# Patient Record
Sex: Female | Born: 1965 | Race: Black or African American | Hispanic: No | Smoking: Current every day smoker
Health system: Southern US, Community
[De-identification: ages and names within clinical notes are randomized; demographics above are authoritative.]

---

## 1999-12-30 ENCOUNTER — Encounter: Payer: Self-pay | Admitting: Emergency Medicine

## 1999-12-30 ENCOUNTER — Emergency Department (HOSPITAL_COMMUNITY): Admission: EM | Admit: 1999-12-30 | Discharge: 1999-12-30 | Payer: Self-pay | Admitting: Emergency Medicine

## 2001-02-02 ENCOUNTER — Emergency Department (HOSPITAL_COMMUNITY): Admission: EM | Admit: 2001-02-02 | Discharge: 2001-02-03 | Payer: Self-pay | Admitting: Emergency Medicine

## 2001-07-07 ENCOUNTER — Encounter: Payer: Self-pay | Admitting: Emergency Medicine

## 2001-07-07 ENCOUNTER — Emergency Department (HOSPITAL_COMMUNITY): Admission: EM | Admit: 2001-07-07 | Discharge: 2001-07-07 | Payer: Self-pay | Admitting: Emergency Medicine

## 2001-12-06 ENCOUNTER — Emergency Department (HOSPITAL_COMMUNITY): Admission: EM | Admit: 2001-12-06 | Discharge: 2001-12-06 | Payer: Self-pay | Admitting: Emergency Medicine

## 2002-01-18 ENCOUNTER — Emergency Department (HOSPITAL_COMMUNITY): Admission: EM | Admit: 2002-01-18 | Discharge: 2002-01-18 | Payer: Self-pay | Admitting: Emergency Medicine

## 2002-10-22 ENCOUNTER — Emergency Department (HOSPITAL_COMMUNITY): Admission: EM | Admit: 2002-10-22 | Discharge: 2002-10-22 | Payer: Self-pay | Admitting: Emergency Medicine

## 2002-11-25 ENCOUNTER — Encounter: Payer: Self-pay | Admitting: Emergency Medicine

## 2002-11-25 ENCOUNTER — Emergency Department (HOSPITAL_COMMUNITY): Admission: EM | Admit: 2002-11-25 | Discharge: 2002-11-25 | Payer: Self-pay | Admitting: Emergency Medicine

## 2003-02-05 ENCOUNTER — Emergency Department (HOSPITAL_COMMUNITY): Admission: EM | Admit: 2003-02-05 | Discharge: 2003-02-05 | Payer: Self-pay | Admitting: Emergency Medicine

## 2003-02-05 ENCOUNTER — Encounter: Payer: Self-pay | Admitting: Emergency Medicine

## 2003-04-17 ENCOUNTER — Emergency Department (HOSPITAL_COMMUNITY): Admission: EM | Admit: 2003-04-17 | Discharge: 2003-04-17 | Payer: Self-pay | Admitting: Emergency Medicine

## 2003-04-17 ENCOUNTER — Encounter: Payer: Self-pay | Admitting: Emergency Medicine

## 2003-05-01 ENCOUNTER — Emergency Department (HOSPITAL_COMMUNITY): Admission: EM | Admit: 2003-05-01 | Discharge: 2003-05-01 | Payer: Self-pay | Admitting: *Deleted

## 2003-05-01 ENCOUNTER — Encounter: Payer: Self-pay | Admitting: Emergency Medicine

## 2003-05-16 ENCOUNTER — Encounter: Admission: RE | Admit: 2003-05-16 | Discharge: 2003-05-16 | Payer: Self-pay | Admitting: Internal Medicine

## 2003-06-02 ENCOUNTER — Ambulatory Visit (HOSPITAL_COMMUNITY): Admission: RE | Admit: 2003-06-02 | Discharge: 2003-06-02 | Payer: Self-pay | Admitting: Internal Medicine

## 2003-06-02 ENCOUNTER — Encounter: Payer: Self-pay | Admitting: Internal Medicine

## 2003-06-04 ENCOUNTER — Encounter: Admission: RE | Admit: 2003-06-04 | Discharge: 2003-06-04 | Payer: Self-pay | Admitting: Internal Medicine

## 2003-06-09 ENCOUNTER — Encounter: Admission: RE | Admit: 2003-06-09 | Discharge: 2003-06-09 | Payer: Self-pay | Admitting: Internal Medicine

## 2003-06-09 ENCOUNTER — Ambulatory Visit (HOSPITAL_COMMUNITY): Admission: RE | Admit: 2003-06-09 | Discharge: 2003-06-09 | Payer: Self-pay | Admitting: Internal Medicine

## 2003-06-09 ENCOUNTER — Encounter: Payer: Self-pay | Admitting: Internal Medicine

## 2003-10-07 ENCOUNTER — Encounter: Admission: RE | Admit: 2003-10-07 | Discharge: 2003-10-07 | Payer: Self-pay | Admitting: Internal Medicine

## 2003-10-07 ENCOUNTER — Ambulatory Visit (HOSPITAL_COMMUNITY): Admission: RE | Admit: 2003-10-07 | Discharge: 2003-10-07 | Payer: Self-pay | Admitting: Internal Medicine

## 2003-10-14 ENCOUNTER — Emergency Department (HOSPITAL_COMMUNITY): Admission: EM | Admit: 2003-10-14 | Discharge: 2003-10-14 | Payer: Self-pay | Admitting: Family Medicine

## 2003-11-10 ENCOUNTER — Emergency Department (HOSPITAL_COMMUNITY): Admission: EM | Admit: 2003-11-10 | Discharge: 2003-11-10 | Payer: Self-pay | Admitting: Emergency Medicine

## 2003-12-23 ENCOUNTER — Emergency Department (HOSPITAL_COMMUNITY): Admission: EM | Admit: 2003-12-23 | Discharge: 2003-12-23 | Payer: Self-pay | Admitting: Family Medicine

## 2004-04-01 ENCOUNTER — Encounter: Admission: RE | Admit: 2004-04-01 | Discharge: 2004-04-01 | Payer: Self-pay | Admitting: Internal Medicine

## 2004-04-05 ENCOUNTER — Ambulatory Visit (HOSPITAL_COMMUNITY): Admission: RE | Admit: 2004-04-05 | Discharge: 2004-04-05 | Payer: Self-pay | Admitting: Internal Medicine

## 2004-04-08 ENCOUNTER — Encounter: Admission: RE | Admit: 2004-04-08 | Discharge: 2004-04-08 | Payer: Self-pay | Admitting: Internal Medicine

## 2004-04-29 ENCOUNTER — Ambulatory Visit: Payer: Self-pay | Admitting: Internal Medicine

## 2004-07-19 ENCOUNTER — Emergency Department (HOSPITAL_COMMUNITY): Admission: EM | Admit: 2004-07-19 | Discharge: 2004-07-19 | Payer: Self-pay | Admitting: Emergency Medicine

## 2004-09-20 ENCOUNTER — Emergency Department (HOSPITAL_COMMUNITY): Admission: EM | Admit: 2004-09-20 | Discharge: 2004-09-20 | Payer: Self-pay | Admitting: Emergency Medicine

## 2004-10-12 ENCOUNTER — Ambulatory Visit: Payer: Self-pay | Admitting: Internal Medicine

## 2004-10-12 ENCOUNTER — Ambulatory Visit (HOSPITAL_COMMUNITY): Admission: RE | Admit: 2004-10-12 | Discharge: 2004-10-12 | Payer: Self-pay | Admitting: Internal Medicine

## 2004-10-21 ENCOUNTER — Ambulatory Visit (HOSPITAL_COMMUNITY): Admission: RE | Admit: 2004-10-21 | Discharge: 2004-10-21 | Payer: Self-pay | Admitting: Family Medicine

## 2004-12-21 ENCOUNTER — Emergency Department (HOSPITAL_COMMUNITY): Admission: EM | Admit: 2004-12-21 | Discharge: 2004-12-21 | Payer: Self-pay | Admitting: Emergency Medicine

## 2005-02-23 ENCOUNTER — Ambulatory Visit: Payer: Self-pay | Admitting: Internal Medicine

## 2005-02-24 ENCOUNTER — Ambulatory Visit (HOSPITAL_COMMUNITY): Admission: RE | Admit: 2005-02-24 | Discharge: 2005-02-24 | Payer: Self-pay | Admitting: Pulmonary Disease

## 2005-11-16 ENCOUNTER — Emergency Department (HOSPITAL_COMMUNITY): Admission: EM | Admit: 2005-11-16 | Discharge: 2005-11-16 | Payer: Self-pay | Admitting: Emergency Medicine

## 2005-11-21 ENCOUNTER — Ambulatory Visit: Payer: Self-pay | Admitting: Hospitalist

## 2005-12-07 ENCOUNTER — Ambulatory Visit: Payer: Self-pay | Admitting: Pulmonary Disease

## 2006-03-23 ENCOUNTER — Ambulatory Visit: Payer: Self-pay | Admitting: Internal Medicine

## 2006-04-05 ENCOUNTER — Ambulatory Visit: Payer: Self-pay | Admitting: Internal Medicine

## 2006-04-05 ENCOUNTER — Ambulatory Visit (HOSPITAL_COMMUNITY): Admission: RE | Admit: 2006-04-05 | Discharge: 2006-04-05 | Payer: Self-pay | Admitting: Internal Medicine

## 2006-07-24 DIAGNOSIS — J984 Other disorders of lung: Secondary | ICD-10-CM

## 2006-07-24 DIAGNOSIS — M2550 Pain in unspecified joint: Secondary | ICD-10-CM | POA: Insufficient documentation

## 2006-07-24 DIAGNOSIS — T148XXA Other injury of unspecified body region, initial encounter: Secondary | ICD-10-CM | POA: Insufficient documentation

## 2006-07-24 DIAGNOSIS — K219 Gastro-esophageal reflux disease without esophagitis: Secondary | ICD-10-CM | POA: Insufficient documentation

## 2007-02-28 ENCOUNTER — Ambulatory Visit: Payer: Self-pay | Admitting: Hospitalist

## 2007-02-28 DIAGNOSIS — M25569 Pain in unspecified knee: Secondary | ICD-10-CM | POA: Insufficient documentation

## 2007-02-28 DIAGNOSIS — F17200 Nicotine dependence, unspecified, uncomplicated: Secondary | ICD-10-CM | POA: Insufficient documentation

## 2007-03-01 DIAGNOSIS — J449 Chronic obstructive pulmonary disease, unspecified: Secondary | ICD-10-CM

## 2007-03-01 HISTORY — DX: Chronic obstructive pulmonary disease, unspecified: J44.9

## 2007-03-05 ENCOUNTER — Telehealth: Payer: Self-pay | Admitting: Licensed Clinical Social Worker

## 2007-03-12 ENCOUNTER — Encounter: Payer: Self-pay | Admitting: Licensed Clinical Social Worker

## 2007-05-09 ENCOUNTER — Ambulatory Visit (HOSPITAL_COMMUNITY): Admission: RE | Admit: 2007-05-09 | Discharge: 2007-05-09 | Payer: Self-pay | Admitting: Internal Medicine

## 2007-05-09 ENCOUNTER — Encounter (INDEPENDENT_AMBULATORY_CARE_PROVIDER_SITE_OTHER): Payer: Self-pay | Admitting: Internal Medicine

## 2007-05-09 ENCOUNTER — Ambulatory Visit: Payer: Self-pay | Admitting: Internal Medicine

## 2007-05-09 DIAGNOSIS — R079 Chest pain, unspecified: Secondary | ICD-10-CM

## 2007-07-19 ENCOUNTER — Ambulatory Visit (HOSPITAL_BASED_OUTPATIENT_CLINIC_OR_DEPARTMENT_OTHER): Admission: RE | Admit: 2007-07-19 | Discharge: 2007-07-19 | Payer: Self-pay | Admitting: Orthopedic Surgery

## 2007-12-10 ENCOUNTER — Emergency Department (HOSPITAL_COMMUNITY): Admission: EM | Admit: 2007-12-10 | Discharge: 2007-12-10 | Payer: Self-pay | Admitting: Emergency Medicine

## 2008-03-11 ENCOUNTER — Ambulatory Visit (HOSPITAL_COMMUNITY): Admission: RE | Admit: 2008-03-11 | Discharge: 2008-03-11 | Payer: Self-pay | Admitting: Orthopedic Surgery

## 2008-03-13 ENCOUNTER — Emergency Department (HOSPITAL_COMMUNITY): Admission: EM | Admit: 2008-03-13 | Discharge: 2008-03-13 | Payer: Self-pay | Admitting: Emergency Medicine

## 2008-04-28 ENCOUNTER — Encounter (INDEPENDENT_AMBULATORY_CARE_PROVIDER_SITE_OTHER): Payer: Self-pay | Admitting: Internal Medicine

## 2008-04-28 ENCOUNTER — Ambulatory Visit: Payer: Self-pay | Admitting: Internal Medicine

## 2008-04-28 DIAGNOSIS — R1032 Left lower quadrant pain: Secondary | ICD-10-CM | POA: Insufficient documentation

## 2008-04-28 LAB — CONVERTED CEMR LAB
ALT: 16 units/L (ref 0–35)
AST: 19 units/L (ref 0–37)
Basophils Absolute: 0 10*3/uL (ref 0.0–0.1)
Basophils Relative: 0 % (ref 0–1)
Calcium: 9.5 mg/dL (ref 8.4–10.5)
Chlamydia, DNA Probe: NEGATIVE
Chloride: 108 meq/L (ref 96–112)
Creatinine, Ser: 1 mg/dL (ref 0.40–1.20)
Hemoglobin: 12.2 g/dL (ref 12.0–15.0)
MCHC: 30.8 g/dL (ref 30.0–36.0)
Monocytes Absolute: 0.3 10*3/uL (ref 0.1–1.0)
Neutro Abs: 4.4 10*3/uL (ref 1.7–7.7)
Neutrophils Relative %: 63 % (ref 43–77)
RDW: 13.9 % (ref 11.5–15.5)
Total Bilirubin: 0.3 mg/dL (ref 0.3–1.2)

## 2008-04-29 ENCOUNTER — Encounter (INDEPENDENT_AMBULATORY_CARE_PROVIDER_SITE_OTHER): Payer: Self-pay | Admitting: Internal Medicine

## 2008-04-29 LAB — CONVERTED CEMR LAB
Candida species: NEGATIVE
Gardnerella vaginalis: POSITIVE — AB
Trichomonal Vaginitis: NEGATIVE

## 2008-04-30 ENCOUNTER — Telehealth: Payer: Self-pay | Admitting: *Deleted

## 2008-05-06 ENCOUNTER — Telehealth (INDEPENDENT_AMBULATORY_CARE_PROVIDER_SITE_OTHER): Payer: Self-pay | Admitting: Internal Medicine

## 2008-05-07 ENCOUNTER — Encounter (INDEPENDENT_AMBULATORY_CARE_PROVIDER_SITE_OTHER): Payer: Self-pay | Admitting: Internal Medicine

## 2008-07-31 ENCOUNTER — Telehealth (INDEPENDENT_AMBULATORY_CARE_PROVIDER_SITE_OTHER): Payer: Self-pay | Admitting: Internal Medicine

## 2008-08-05 ENCOUNTER — Emergency Department (HOSPITAL_COMMUNITY): Admission: EM | Admit: 2008-08-05 | Discharge: 2008-08-05 | Payer: Self-pay | Admitting: Emergency Medicine

## 2008-08-07 ENCOUNTER — Ambulatory Visit (HOSPITAL_COMMUNITY): Admission: RE | Admit: 2008-08-07 | Discharge: 2008-08-07 | Payer: Self-pay | Admitting: Orthopedic Surgery

## 2008-08-22 ENCOUNTER — Encounter: Admission: RE | Admit: 2008-08-22 | Discharge: 2008-09-03 | Payer: Self-pay | Admitting: Orthopedic Surgery

## 2008-10-06 ENCOUNTER — Ambulatory Visit: Payer: Self-pay | Admitting: Internal Medicine

## 2008-10-06 ENCOUNTER — Telehealth: Payer: Self-pay | Admitting: Internal Medicine

## 2008-10-06 ENCOUNTER — Encounter: Payer: Self-pay | Admitting: Internal Medicine

## 2008-10-06 LAB — CONVERTED CEMR LAB
BUN: 11 mg/dL (ref 6–23)
CO2: 19 meq/L (ref 19–32)
Chloride: 106 meq/L (ref 96–112)
Creatinine, Ser: 1.12 mg/dL (ref 0.40–1.20)
Hemoglobin: 14.2 g/dL (ref 12.0–15.0)
Lymphocytes Relative: 35 % (ref 12–46)
Lymphs Abs: 2.3 10*3/uL (ref 0.7–4.0)
Monocytes Absolute: 0.5 10*3/uL (ref 0.1–1.0)
Monocytes Relative: 8 % (ref 3–12)
Neutro Abs: 3.7 10*3/uL (ref 1.7–7.7)
RBC: 4.72 M/uL (ref 3.87–5.11)
WBC: 6.6 10*3/uL (ref 4.0–10.5)

## 2008-12-09 ENCOUNTER — Ambulatory Visit: Payer: Self-pay | Admitting: Internal Medicine

## 2008-12-09 ENCOUNTER — Encounter (INDEPENDENT_AMBULATORY_CARE_PROVIDER_SITE_OTHER): Payer: Self-pay | Admitting: Internal Medicine

## 2008-12-09 DIAGNOSIS — M5126 Other intervertebral disc displacement, lumbar region: Secondary | ICD-10-CM

## 2008-12-24 ENCOUNTER — Encounter (INDEPENDENT_AMBULATORY_CARE_PROVIDER_SITE_OTHER): Payer: Self-pay | Admitting: Internal Medicine

## 2009-01-19 ENCOUNTER — Encounter (INDEPENDENT_AMBULATORY_CARE_PROVIDER_SITE_OTHER): Payer: Self-pay | Admitting: Internal Medicine

## 2009-03-13 ENCOUNTER — Ambulatory Visit: Payer: Self-pay | Admitting: Internal Medicine

## 2009-03-23 LAB — CONVERTED CEMR LAB
AST: 23 units/L (ref 0–37)
Alkaline Phosphatase: 94 units/L (ref 39–117)
BUN: 7 mg/dL (ref 6–23)
Calcium: 10.5 mg/dL (ref 8.4–10.5)
Creatinine, Ser: 1.03 mg/dL (ref 0.40–1.20)
Glucose, Bld: 85 mg/dL (ref 70–99)
HDL: 51 mg/dL (ref >39)
LDL Cholesterol: 154 mg/dL — ABNORMAL HIGH (ref 0–99)
Total CHOL/HDL Ratio: 4.9
Triglycerides: 219 mg/dL — ABNORMAL HIGH (ref <150)

## 2009-03-24 ENCOUNTER — Telehealth: Payer: Self-pay | Admitting: *Deleted

## 2009-03-25 ENCOUNTER — Encounter: Payer: Self-pay | Admitting: Internal Medicine

## 2009-05-08 ENCOUNTER — Telehealth: Payer: Self-pay | Admitting: *Deleted

## 2009-06-08 ENCOUNTER — Ambulatory Visit: Payer: Self-pay | Admitting: Internal Medicine

## 2009-06-30 ENCOUNTER — Emergency Department (HOSPITAL_COMMUNITY): Admission: EM | Admit: 2009-06-30 | Discharge: 2009-06-30 | Payer: Self-pay | Admitting: Emergency Medicine

## 2010-09-06 ENCOUNTER — Encounter: Payer: Self-pay | Admitting: Orthopedic Surgery

## 2010-09-12 LAB — CONVERTED CEMR LAB
BUN: 10 mg/dL (ref 6–23)
Calcium: 8.9 mg/dL (ref 8.4–10.5)
Creatinine, Ser: 0.91 mg/dL (ref 0.40–1.20)
Glucose, Bld: 93 mg/dL (ref 70–99)

## 2010-12-28 NOTE — Op Note (Signed)
NAME:  Carrie Adkins NO.:  [id number]   MEDICAL RECORD NO.:  [id number]          PATIENT TYPE:  AMB   LOCATION:  DAY                          FACILITY:  [facility]   PHYSICIAN:  Deidre Ala, M.D.    DATE OF BIRTH:  Oct 11, 1965   DATE OF PROCEDURE:  08/07/2008  DATE OF DISCHARGE:                               OPERATIVE REPORT   PREOPERATIVE DIAGNOSIS:  Right knee degenerative joint disease, status  post previous patellar realignment in the remote past, with peripatellar  synovitis.   POSTOPERATIVE DIAGNOSES:  1. Right knee grade 3-4 degenerative joint disease medial femoral      trochlea.  2. Grade 2 chondromalacia patella.  3. Degenerative inner rim lateral meniscus tearing.  4. Parapatellar synovitis.   PROCEDURES:  1. Right knee operative arthroscopy with debridement.  2. Ablation abrasion chondroplasty medial femoral condyle and      posterior patella.  3. Lateral meniscal shaving.  4. Parapatellar synovectomy.   SURGEON:  1. Charlesetta Shanks, M.D.   ASSISTANT:  Phineas Semen, P.A.-C.   ANESTHESIA:  General with LMA.   CULTURES:  None.   DRAINS:  None.   ESTIMATED BLOOD LOSS:  Minimal.   TOURNIQUET TIME:  45 minutes.   PATHOLOGIC FINDINGS AND HISTORY:  Carrie Adkins is a 45 year old female who had  pretty much the same pathology in the other knee, scoped 1 year ago.  The problem was that as a young person she had bilateral knee patellar  realignments done in the far past with transverse incisions.  She had  patellofemoral DJD on the other knee, peripatellar synovitis.  She was  complaining of knee pain.  The other knee had done quite well over the  year after debridement.  She is having also issues with her back that  need to be cleared up, and there may be some tissue that at some point  she may be going to a knee replacement.  She had degenerative lumbar  disc disease, with radiculopathy on the left side.  In any case, we  elected to proceed with right  knee arthroscopy.  At surgery she had  grade 3-4 DJD all long the medial femoral trochlea, probably the size of  a half-dollar.  The weightbearing surface looked good.  The posterior  patella had fissuring, grade 2.  She had peripatellar synovitis,  patellar lateral tilt and track.  ACL was intact.  Medial meniscus was  intact, with degenerative inner rim fraying.  We did abrasion ablation  chondroplasty with the ablator on 1 on the femoral condyle and posterior  patella.  We did a debridement of the lateral meniscus with basket and  shaver and smoothing with the ablator, and also she had some  degenerative anterolateral meniscus that we shaved and ablated.   PROCEDURE:  Adequate anesthesia obtained using LMA technique; 1 g Ancef  given IV prophylaxis.  The patient was placed in the supine beach-chair  position.  The right lower extremity was prepped from the malleoli to  the leg holder in the standard fashion.  After standard prepping and  draping, Esmarch exsanguination  was used.  The tourniquet was let up to  350 mmHg.  Superolateral inflow portal was made.  The knee was  insufflated with normal saline with an arthroscopic pump.  Medial and  lateral scope portals were then made, and the joint was thoroughly  inspected.  I then shaved the medial synovitis back to the sidewall and  lysed the medial band.  I then probed and checked the medial meniscus.  I then used the shaver and ablator on 1 to smooth the large defect on  the trochlea.  I reversed portals, shaved out lateral gutter synovitis,  probed the lateral meniscus, and used basket and shaver  and ablator to  saucerize the medial edge.  The patient then had the knee irrigated  through the scope and 0.5% Marcaine injected about the portals.  The  portals were left open.  Bulky sterile compressive dressing was applied  with Easy Wrap.  The patient, having tolerated the procedure well, was  awakened and taken to the recovery room in  satisfactory condition, to be  discharged per outpatient routine, given Percocet for pain, and dressing  changes to change her dressing over the holidays, as she is familiar  with her knee, and told call the office for an appointment for recheck  next week.     Please note, her laboratory data was within normal limits.      Deidre Ala, M.D.  Electronically Signed     VEP/MEDQ  D:  08/07/2008  T:  08/07/2008  Job:  [id number]   cc:   [facility]   Katherine Roan, MD   Laroy Apple, M.D.

## 2010-12-28 NOTE — Op Note (Signed)
NAME:  Carrie Adkins NO.:  [id number]   MEDICAL RECORD NO.:  [id number]          PATIENT TYPE:  AMB   LOCATION:  [facility]                         FACILITY:  [facility]   PHYSICIAN:  Deidre Ala, M.D.    DATE OF BIRTH:  April 05, 1966   DATE OF PROCEDURE:  07/19/2007  DATE OF DISCHARGE:                               OPERATIVE REPORT   PREOPERATIVE DIAGNOSIS:  Left knee osteoarthritis, most likely  patellofemoral joint with medial compartment narrowing status post left  Hauser type tibial tubercle realignment years ago elsewhere.   POSTOPERATIVE DIAGNOSES:  1. Left knee osteoarthritis, most likely patellofemoral joint with      medial compartment narrowing status post left Hauser type tibial      tubercle realignment years ago elsewhere, with grade 3 to 4      degenerative joint disease trochlea.  2. Anterolateral meniscus tear.  3. Large medial and lateral plicas  4. Tight lateral retinaculum.   PROCEDURE:  1. Left knee operative arthroscopy with abrasion ablation      chondroplasty patellofemoral joint.  2. Light debridement medial femoral condyle.  3. Partial anterolateral meniscectomy.  4. Lateral retinacular release.  5. Medial and lateral plica excisions.   SURGEON:  Doristine Section, M.D.   ASSISTANT:  Phineas Semen, P.A.-C.   ANESTHESIA:  General with LMA.   CULTURES:  None.   DRAINS:  None.   ESTIMATED BLOOD LOSS:  Minimal.   TOURNIQUET TIME:  30 minutes.   PATHOLOGIC FINDINGS AND HISTORY:  Carrie Adkins is a 45 year old  referred from [facility] for her left knee.  In her  childhood, age 41, she had an operation for bilateral knee tibial  tubercle transplants.  This is some 24 years ago.  These were through  transverse incisions.  She has presented to [place] with bilateral knee pain  left greater than right.  X-ray showed medial joint line narrowing on  the left with squaring changes not yet bone on bone, some osteophytes in  the  patellofemoral joint with lateral showing significant patella alto  and lateral patellar tilt.  We first tried cortisone injections.  We did  not have the option with her Medicaid status of using  Supartz or  Hyalgan.  She continued to have discomfort, so we elected to proceed  with diagnostic and operative arthroscopy.  At surgery, she had a vast  area on the middle trochlea and medially three-quarters of the trochlea  grade 3 to 4.  There was a light grade 3 on the medial femoral condyle.  Medial meniscus looked good.  ACL was intact.  She had an anterior horn  lateral meniscus tear with some degenerative fraying posterior to that.  She had large medial lateral plicas which were probably her most  symptomatic site, and she had a tight lateral retinaculum with some scar  tissues in the lateral gutter.  We did abrasion ablation chondroplasties  of the zone on the trochlea, the posterior patella, the medial femoral  condyle.  We then did anterolateral meniscectomy with the ablator used  to smooth, an arthroscopic lateral  retinacular release, and medial and  lateral plica excisions.   PROCEDURE:  With adequate anesthesia obtained using LMA technique, 1  gram Ancef given IV prophylaxis, the patient was placed in the supine  position.  The left lower extremity was prepped from the malleoli to the  leg holder in a standard fashion.  After standard prepping and draping,  Esmarch examination was used.  The tourniquet was let up to 350 mmHg.  Superior lateral inflow portal was made.  The knee was insufflated with  normal saline with the arthroscopic pump.  Medial and lateral scope  portals were then made, and the joint was thoroughly inspected.  I then  shaved out the medial plica back to the sidewall and lysed the medial  band.  It was extensive.  I then used the shaver and the ablator on 1 to  smooth the trochlear defect as well as the posterior patella.  I then  inspected the medial meniscus  and probed it.  It was stable, lightly  debrided and used the ablator on the medial femoral condyle.  I then  turned attention to the lateral joint line where the anterolateral  meniscal tear was noted.  Collene Mares was brought.  The inner rim and the  anterior horn was shaved smooth and sealed with the ablator on 1.  I  then shaved out the lateral plica.  I then observed tilt and track and  did an arthroscopic lateral retinacular release from vastus lateralis to  the joint line, also removing some scar tissue in the lateral  compartment as well as some synovitis in the superior pouch.  The  kneecap still tracked rather laterally despite this maneuver and our  previous procedures.  The knee was then irrigated through the scope,  0.5% Marcaine injected in and about the portals with morphine.  The  portals were left open.  A bulky sterile compressive dressing was  applied with lateral foam pad for tamponade and easy wrap placed.  The  patient then having tolerated the procedure well was awakened, taken to  recovery room in satisfactory condition, to be discharged per outpatient  routine given Percocet for pain and told to call the office for recheck  tomorrow.           ______________________________  V. Charlesetta Shanks, M.D.     VEP/MEDQ  D:  07/19/2007  T:  07/19/2007  Job:  [id number]

## 2011-05-10 LAB — CBC
HCT: 38.9
MCHC: 33.4
Platelets: 365
RDW: 14.2

## 2011-05-10 LAB — POCT I-STAT, CHEM 8
BUN: 5 — ABNORMAL LOW
Calcium, Ion: 1.14
Chloride: 99
Creatinine, Ser: 1.3 — ABNORMAL HIGH
Glucose, Bld: 162 — ABNORMAL HIGH
HCT: 42
Hemoglobin: 14.3
Potassium: 3.7
Sodium: 133 — ABNORMAL LOW
TCO2: 25

## 2011-05-10 LAB — POCT CARDIAC MARKERS
CKMB, poc: 1.6
Myoglobin, poc: 88.3
Operator id: 288331
Troponin i, poc: <0.05

## 2011-05-13 LAB — POCT I-STAT, CHEM 8
Calcium, Ion: 1.12
Chloride: 108
Glucose, Bld: 91
HCT: 40
Hemoglobin: 13.6
Potassium: 4.1

## 2011-05-13 LAB — DIFFERENTIAL
Basophils Absolute: 0
Basophils Relative: 1
Eosinophils Absolute: 0.1
Eosinophils Relative: 3
Monocytes Absolute: 0.4

## 2011-05-13 LAB — URINALYSIS, ROUTINE W REFLEX MICROSCOPIC
Bilirubin Urine: NEGATIVE
Glucose, UA: NEGATIVE
Ketones, ur: NEGATIVE
Specific Gravity, Urine: 1.019
pH: 7.5

## 2011-05-13 LAB — URINE MICROSCOPIC-ADD ON

## 2011-05-13 LAB — CBC
HCT: 38.8
MCHC: 34
MCV: 93.7
Platelets: 318
RDW: 14

## 2011-05-13 LAB — POCT PREGNANCY, URINE: Preg Test, Ur: NEGATIVE

## 2011-05-13 LAB — POCT I-STAT 3, ART BLOOD GAS (G3+)
Bicarbonate: 22.1
Patient temperature: 98.5
TCO2: 23
pH, Arterial: 7.508 — ABNORMAL HIGH

## 2011-05-13 LAB — POCT CARDIAC MARKERS
Myoglobin, poc: 67
Troponin i, poc: <0.05

## 2011-05-13 LAB — D-DIMER, QUANTITATIVE: D-Dimer, Quant: 0.23

## 2011-05-13 LAB — B-NATRIURETIC PEPTIDE (CONVERTED LAB): Pro B Natriuretic peptide (BNP): <30

## 2011-05-20 LAB — HEMOGLOBIN AND HEMATOCRIT, BLOOD: HCT: 38.5 % (ref 36.0–46.0)

## 2011-05-23 LAB — POCT HEMOGLOBIN-HEMACUE: Hemoglobin: 13.3

## 2021-04-21 ENCOUNTER — Other Ambulatory Visit: Payer: Self-pay

## 2021-04-21 ENCOUNTER — Encounter (HOSPITAL_COMMUNITY): Payer: Self-pay

## 2021-04-21 ENCOUNTER — Emergency Department (HOSPITAL_COMMUNITY): Payer: Medicaid Other

## 2021-04-21 ENCOUNTER — Inpatient Hospital Stay (HOSPITAL_COMMUNITY)
Admission: EM | Admit: 2021-04-21 | Discharge: 2021-04-28 | DRG: 092 | Disposition: A | Discharge status: Other Acute Inpatient Hospital | Payer: Medicaid Other | Attending: Internal Medicine | Admitting: Internal Medicine

## 2021-04-21 DIAGNOSIS — R001 Bradycardia, unspecified: Secondary | ICD-10-CM | POA: Diagnosis present

## 2021-04-21 DIAGNOSIS — F1410 Cocaine abuse, uncomplicated: Secondary | ICD-10-CM | POA: Diagnosis present

## 2021-04-21 DIAGNOSIS — Z5902 Unsheltered homelessness: Secondary | ICD-10-CM

## 2021-04-21 DIAGNOSIS — F17210 Nicotine dependence, cigarettes, uncomplicated: Secondary | ICD-10-CM | POA: Diagnosis present

## 2021-04-21 DIAGNOSIS — E876 Hypokalemia: Secondary | ICD-10-CM

## 2021-04-21 DIAGNOSIS — J449 Chronic obstructive pulmonary disease, unspecified: Secondary | ICD-10-CM | POA: Diagnosis present

## 2021-04-21 DIAGNOSIS — I5032 Chronic diastolic (congestive) heart failure: Secondary | ICD-10-CM | POA: Diagnosis present

## 2021-04-21 DIAGNOSIS — I110 Hypertensive heart disease with heart failure: Secondary | ICD-10-CM | POA: Diagnosis present

## 2021-04-21 DIAGNOSIS — R4182 Altered mental status, unspecified: Secondary | ICD-10-CM

## 2021-04-21 DIAGNOSIS — F32A Depression, unspecified: Secondary | ICD-10-CM | POA: Diagnosis present

## 2021-04-21 DIAGNOSIS — R202 Paresthesia of skin: Secondary | ICD-10-CM | POA: Diagnosis present

## 2021-04-21 DIAGNOSIS — R270 Ataxia, unspecified: Secondary | ICD-10-CM

## 2021-04-21 DIAGNOSIS — R112 Nausea with vomiting, unspecified: Secondary | ICD-10-CM | POA: Diagnosis present

## 2021-04-21 DIAGNOSIS — B9689 Other specified bacterial agents as the cause of diseases classified elsewhere: Secondary | ICD-10-CM | POA: Diagnosis present

## 2021-04-21 DIAGNOSIS — R200 Anesthesia of skin: Secondary | ICD-10-CM | POA: Diagnosis present

## 2021-04-21 DIAGNOSIS — I2510 Atherosclerotic heart disease of native coronary artery without angina pectoris: Secondary | ICD-10-CM | POA: Diagnosis present

## 2021-04-21 DIAGNOSIS — R7401 Elevation of levels of liver transaminase levels: Secondary | ICD-10-CM | POA: Diagnosis present

## 2021-04-21 DIAGNOSIS — F1210 Cannabis abuse, uncomplicated: Secondary | ICD-10-CM | POA: Diagnosis present

## 2021-04-21 DIAGNOSIS — F419 Anxiety disorder, unspecified: Secondary | ICD-10-CM | POA: Diagnosis present

## 2021-04-21 DIAGNOSIS — R2681 Unsteadiness on feet: Principal | ICD-10-CM | POA: Diagnosis present

## 2021-04-21 DIAGNOSIS — K219 Gastro-esophageal reflux disease without esophagitis: Secondary | ICD-10-CM | POA: Diagnosis present

## 2021-04-21 DIAGNOSIS — F1910 Other psychoactive substance abuse, uncomplicated: Secondary | ICD-10-CM | POA: Diagnosis present

## 2021-04-21 DIAGNOSIS — R45851 Suicidal ideations: Secondary | ICD-10-CM

## 2021-04-21 DIAGNOSIS — Z202 Contact with and (suspected) exposure to infections with a predominantly sexual mode of transmission: Secondary | ICD-10-CM | POA: Diagnosis present

## 2021-04-21 DIAGNOSIS — R296 Repeated falls: Secondary | ICD-10-CM | POA: Diagnosis present

## 2021-04-21 DIAGNOSIS — Z20822 Contact with and (suspected) exposure to covid-19: Secondary | ICD-10-CM | POA: Diagnosis present

## 2021-04-21 DIAGNOSIS — G4700 Insomnia, unspecified: Secondary | ICD-10-CM | POA: Diagnosis present

## 2021-04-21 DIAGNOSIS — N760 Acute vaginitis: Secondary | ICD-10-CM | POA: Diagnosis present

## 2021-04-21 LAB — ETHANOL: Alcohol, Ethyl (B): <10 mg/dL (ref <10)

## 2021-04-21 LAB — DIFFERENTIAL
Abs Immature Granulocytes: 0.02 10*3/uL (ref 0.00–0.07)
Basophils Absolute: 0.1 10*3/uL (ref 0.0–0.1)
Basophils Relative: 1 %
Eosinophils Absolute: 0.2 10*3/uL (ref 0.0–0.5)
Eosinophils Relative: 2 %
Immature Granulocytes: 0 %
Lymphocytes Relative: 35 %
Lymphs Abs: 3.8 10*3/uL (ref 0.7–4.0)
Monocytes Absolute: 0.8 10*3/uL (ref 0.1–1.0)
Monocytes Relative: 7 %
Neutro Abs: 6 10*3/uL (ref 1.7–7.7)
Neutrophils Relative %: 55 %

## 2021-04-21 LAB — CBC
HCT: 41 % (ref 36.0–46.0)
Hemoglobin: 13.5 g/dL (ref 12.0–15.0)
MCH: 29.8 pg (ref 26.0–34.0)
MCHC: 32.9 g/dL (ref 30.0–36.0)
MCV: 90.5 fL (ref 80.0–100.0)
Platelets: 341 10*3/uL (ref 150–400)
RBC: 4.53 MIL/uL (ref 3.87–5.11)
RDW: 13.8 % (ref 11.5–15.5)
WBC: 10.9 10*3/uL — ABNORMAL HIGH (ref 4.0–10.5)
nRBC: 0 % (ref 0.0–0.2)

## 2021-04-21 LAB — COMPREHENSIVE METABOLIC PANEL
ALT: 43 U/L (ref 0–44)
AST: 107 U/L — ABNORMAL HIGH (ref 15–41)
Albumin: 4.3 g/dL (ref 3.5–5.0)
Alkaline Phosphatase: 88 U/L (ref 38–126)
Anion gap: 8 (ref 5–15)
BUN: 24 mg/dL — ABNORMAL HIGH (ref 6–20)
CO2: 26 mmol/L (ref 22–32)
Calcium: 9.5 mg/dL (ref 8.9–10.3)
Chloride: 109 mmol/L (ref 98–111)
Creatinine, Ser: 1.1 mg/dL — ABNORMAL HIGH (ref 0.44–1.00)
GFR, Estimated: 59 mL/min — ABNORMAL LOW (ref >60)
Glucose, Bld: 108 mg/dL — ABNORMAL HIGH (ref 70–99)
Potassium: 3.2 mmol/L — ABNORMAL LOW (ref 3.5–5.1)
Sodium: 143 mmol/L (ref 135–145)
Total Bilirubin: 0.7 mg/dL (ref 0.3–1.2)
Total Protein: 7.7 g/dL (ref 6.5–8.1)

## 2021-04-21 LAB — PROTIME-INR
INR: 1 (ref 0.8–1.2)
Prothrombin Time: 12.9 seconds (ref 11.4–15.2)

## 2021-04-21 LAB — APTT: aPTT: 27 seconds (ref 24–36)

## 2021-04-21 NOTE — ED Triage Notes (Addendum)
Pt reports intermittent numbness and tingling in arms and legs. Pt also endorses bilateral leg pain. Pt also reports abnormal vaginal discharge that began yesterday and want STD testing. Endorses crack cocaine use

## 2021-04-21 NOTE — ED Notes (Signed)
Pt's sister  stated that pt is still high and that she will like to be in the room when Dr comes to see pt.

## 2021-04-21 NOTE — ED Provider Notes (Signed)
Emergency Medicine Provider Triage Evaluation Note  Carrie Adkins , a 55 y.o. female  was evaluated in triage.  Pt complains of intermittent numbness and tingling to the entire right side of her body.  Patient reports this has been present for multiple months however worse in the past few days.  Patient reports that her symptoms today started 0 900 and have been constant since then.  Additionally patient complains of vaginal discharge that began yesterday.  Patient describes discharge as white in color.  Endorses vaginal pruritus  Review of Systems  Positive: Numbness, weakness, blurred vision, vaginal discharge, vaginal pruritus Negative: Vaginal pain, vaginal bleeding, dysuria, hematuria, urinary frequency,  Physical Exam  BP (!) 150/98 (BP Location: Right Arm)   Pulse 93   Temp 98.1 F (36.7 C) (Oral)   Resp 18   SpO2 97%  Gen:   Awake, no distress   Resp:  Normal effort  MSK:   Moves extremities without difficulty  Other:  CN II through XII intact.  Pronator drift negative.  Prescription equal.  Sensation to light touch intact to bilateral upper and lower extremities.  +5 Strength to bilateral upper and lower extremities.  Medical Decision Making  Medically screening exam initiated at 1:09 PM.  Appropriate orders placed.  Carrie Adkins was informed that the remainder of the evaluation will be completed by another provider, this initial triage assessment does not replace that evaluation, and the importance of remaining in the ED until their evaluation is complete.     Haskel Schroeder, PA-C 04/21/21 1310    Arby Barrette, MD 04/22/21 [phone]

## 2021-04-22 ENCOUNTER — Emergency Department (HOSPITAL_COMMUNITY): Payer: Medicaid Other

## 2021-04-22 ENCOUNTER — Observation Stay (HOSPITAL_COMMUNITY): Payer: Medicaid Other

## 2021-04-22 ENCOUNTER — Encounter (HOSPITAL_COMMUNITY): Payer: Self-pay | Admitting: Internal Medicine

## 2021-04-22 DIAGNOSIS — F17210 Nicotine dependence, cigarettes, uncomplicated: Secondary | ICD-10-CM

## 2021-04-22 DIAGNOSIS — I2510 Atherosclerotic heart disease of native coronary artery without angina pectoris: Secondary | ICD-10-CM

## 2021-04-22 DIAGNOSIS — N760 Acute vaginitis: Secondary | ICD-10-CM | POA: Diagnosis not present

## 2021-04-22 DIAGNOSIS — R2681 Unsteadiness on feet: Secondary | ICD-10-CM | POA: Diagnosis present

## 2021-04-22 DIAGNOSIS — F1910 Other psychoactive substance abuse, uncomplicated: Secondary | ICD-10-CM

## 2021-04-22 DIAGNOSIS — K219 Gastro-esophageal reflux disease without esophagitis: Secondary | ICD-10-CM

## 2021-04-22 DIAGNOSIS — J449 Chronic obstructive pulmonary disease, unspecified: Secondary | ICD-10-CM | POA: Diagnosis not present

## 2021-04-22 DIAGNOSIS — R45851 Suicidal ideations: Secondary | ICD-10-CM

## 2021-04-22 DIAGNOSIS — B9689 Other specified bacterial agents as the cause of diseases classified elsewhere: Secondary | ICD-10-CM

## 2021-04-22 HISTORY — DX: Atherosclerotic heart disease of native coronary artery without angina pectoris: I25.10

## 2021-04-22 HISTORY — DX: Other psychoactive substance abuse, uncomplicated: F19.10

## 2021-04-22 HISTORY — DX: Nicotine dependence, cigarettes, uncomplicated: F17.210

## 2021-04-22 LAB — COMPREHENSIVE METABOLIC PANEL
ALT: 36 U/L (ref 0–44)
AST: 87 U/L — ABNORMAL HIGH (ref 15–41)
Albumin: 3.8 g/dL (ref 3.5–5.0)
Alkaline Phosphatase: 78 U/L (ref 38–126)
Anion gap: 12 (ref 5–15)
BUN: 16 mg/dL (ref 6–20)
CO2: 24 mmol/L (ref 22–32)
Calcium: 9 mg/dL (ref 8.9–10.3)
Chloride: 105 mmol/L (ref 98–111)
Creatinine, Ser: 1.02 mg/dL — ABNORMAL HIGH (ref 0.44–1.00)
GFR, Estimated: >60 mL/min (ref >60)
Glucose, Bld: 85 mg/dL (ref 70–99)
Potassium: 5.8 mmol/L — ABNORMAL HIGH (ref 3.5–5.1)
Sodium: 141 mmol/L (ref 135–145)
Total Bilirubin: 1.2 mg/dL (ref 0.3–1.2)
Total Protein: 7.3 g/dL (ref 6.5–8.1)

## 2021-04-22 LAB — URINALYSIS, ROUTINE W REFLEX MICROSCOPIC
Bilirubin Urine: NEGATIVE
Glucose, UA: NEGATIVE mg/dL
Ketones, ur: NEGATIVE mg/dL
Nitrite: NEGATIVE
Protein, ur: NEGATIVE mg/dL
Specific Gravity, Urine: 1.025 (ref 1.005–1.030)
pH: 5 (ref 5.0–8.0)

## 2021-04-22 LAB — TSH: TSH: 0.294 u[IU]/mL — ABNORMAL LOW (ref 0.350–4.500)

## 2021-04-22 LAB — MAGNESIUM: Magnesium: 2.4 mg/dL (ref 1.7–2.4)

## 2021-04-22 LAB — CBC WITH DIFFERENTIAL/PLATELET
Abs Immature Granulocytes: 0.02 10*3/uL (ref 0.00–0.07)
Basophils Absolute: 0 10*3/uL (ref 0.0–0.1)
Basophils Relative: 0 %
Eosinophils Absolute: 0.2 10*3/uL (ref 0.0–0.5)
Eosinophils Relative: 3 %
HCT: 43 % (ref 36.0–46.0)
Hemoglobin: 14.2 g/dL (ref 12.0–15.0)
Immature Granulocytes: 0 %
Lymphocytes Relative: 37 %
Lymphs Abs: 2.4 10*3/uL (ref 0.7–4.0)
MCH: 30.5 pg (ref 26.0–34.0)
MCHC: 33 g/dL (ref 30.0–36.0)
MCV: 92.3 fL (ref 80.0–100.0)
Monocytes Absolute: 0.4 10*3/uL (ref 0.1–1.0)
Monocytes Relative: 7 %
Neutro Abs: 3.4 10*3/uL (ref 1.7–7.7)
Neutrophils Relative %: 53 %
Platelets: 287 10*3/uL (ref 150–400)
RBC: 4.66 MIL/uL (ref 3.87–5.11)
RDW: 14.4 % (ref 11.5–15.5)
WBC: 6.4 10*3/uL (ref 4.0–10.5)
nRBC: 0 % (ref 0.0–0.2)

## 2021-04-22 LAB — WET PREP, GENITAL
Sperm: NONE SEEN
Trich, Wet Prep: NONE SEEN
WBC, Wet Prep HPF POC: NONE SEEN
Yeast Wet Prep HPF POC: NONE SEEN

## 2021-04-22 LAB — HEMOGLOBIN A1C
Hgb A1c MFr Bld: 5.4 % (ref 4.8–5.6)
Mean Plasma Glucose: 108.28 mg/dL

## 2021-04-22 LAB — AMMONIA: Ammonia: 34 umol/L (ref 9–35)

## 2021-04-22 LAB — RESP PANEL BY RT-PCR (FLU A&B, COVID) ARPGX2
Influenza A by PCR: NEGATIVE
Influenza B by PCR: NEGATIVE
SARS Coronavirus 2 by RT PCR: NEGATIVE

## 2021-04-22 LAB — RAPID URINE DRUG SCREEN, HOSP PERFORMED
Amphetamines: POSITIVE — AB
Barbiturates: NOT DETECTED
Benzodiazepines: NOT DETECTED
Cocaine: POSITIVE — AB
Opiates: NOT DETECTED
Tetrahydrocannabinol: POSITIVE — AB

## 2021-04-22 LAB — LIPID PANEL
Cholesterol: 194 mg/dL (ref 0–200)
HDL: 48 mg/dL (ref >40)
LDL Cholesterol: 125 mg/dL — ABNORMAL HIGH (ref 0–99)
Total CHOL/HDL Ratio: 4 RATIO
Triglycerides: 106 mg/dL (ref <150)
VLDL: 21 mg/dL (ref 0–40)

## 2021-04-22 LAB — GC/CHLAMYDIA PROBE AMP (~~LOC~~) NOT AT ARMC
Chlamydia: NEGATIVE
Comment: NEGATIVE
Comment: NORMAL
Neisseria Gonorrhea: NEGATIVE

## 2021-04-22 LAB — HIV ANTIBODY (ROUTINE TESTING W REFLEX): HIV Screen 4th Generation wRfx: NONREACTIVE

## 2021-04-22 LAB — FOLATE: Folate: 14.5 ng/mL (ref >5.9)

## 2021-04-22 LAB — VITAMIN B12: Vitamin B-12: 285 pg/mL (ref 180–914)

## 2021-04-22 LAB — RPR: RPR Ser Ql: NONREACTIVE

## 2021-04-22 MED ORDER — THIAMINE HCL 100 MG/ML IJ SOLN
100.0000 mg | Freq: Once | INTRAMUSCULAR | Status: AC
  Administered 2021-04-22: 100 mg via INTRAVENOUS
  Filled 2021-04-22: qty 2

## 2021-04-22 MED ORDER — LORAZEPAM 2 MG/ML IJ SOLN
0.5000 mg | Freq: Once | INTRAMUSCULAR | Status: AC
  Administered 2021-04-22: 0.5 mg via INTRAVENOUS
  Filled 2021-04-22: qty 1

## 2021-04-22 MED ORDER — ASPIRIN EC 81 MG PO TBEC
81.0000 mg | DELAYED_RELEASE_TABLET | Freq: Every day | ORAL | Status: DC
  Administered 2021-04-22 – 2021-04-28 (×7): 81 mg via ORAL
  Filled 2021-04-22 (×7): qty 1

## 2021-04-22 MED ORDER — BENZONATATE 100 MG PO CAPS
200.0000 mg | ORAL_CAPSULE | Freq: Once | ORAL | Status: AC
  Administered 2021-04-23: 200 mg via ORAL
  Filled 2021-04-22: qty 2

## 2021-04-22 MED ORDER — POLYETHYLENE GLYCOL 3350 17 G PO PACK: 17.0000 g | PACK | Freq: Every day | ORAL | Status: DC | PRN

## 2021-04-22 MED ORDER — ACETAMINOPHEN 650 MG RE SUPP: 650.0000 mg | Freq: Four times a day (QID) | RECTAL | Status: DC | PRN

## 2021-04-22 MED ORDER — SODIUM CHLORIDE 0.9 % IV SOLN: INTRAVENOUS | Status: DC

## 2021-04-22 MED ORDER — ONDANSETRON HCL 4 MG/2ML IJ SOLN: 4.0000 mg | Freq: Four times a day (QID) | INTRAMUSCULAR | Status: DC | PRN

## 2021-04-22 MED ORDER — ACETAMINOPHEN 325 MG PO TABS
650.0000 mg | ORAL_TABLET | Freq: Four times a day (QID) | ORAL | Status: DC | PRN
  Administered 2021-04-23 – 2021-04-28 (×6): 650 mg via ORAL
  Filled 2021-04-22 (×6): qty 2

## 2021-04-22 MED ORDER — ONDANSETRON HCL 4 MG PO TABS: 4.0000 mg | ORAL_TABLET | Freq: Four times a day (QID) | ORAL | Status: DC | PRN

## 2021-04-22 MED ORDER — METRONIDAZOLE 500 MG PO TABS
500.0000 mg | ORAL_TABLET | Freq: Two times a day (BID) | ORAL | Status: DC
  Administered 2021-04-22 – 2021-04-28 (×14): 500 mg via ORAL
  Filled 2021-04-22 (×14): qty 1

## 2021-04-22 MED ORDER — POTASSIUM CHLORIDE CRYS ER 20 MEQ PO TBCR
40.0000 meq | EXTENDED_RELEASE_TABLET | Freq: Once | ORAL | Status: AC
  Administered 2021-04-22: 40 meq via ORAL
  Filled 2021-04-22: qty 2

## 2021-04-22 MED ORDER — ENOXAPARIN SODIUM 40 MG/0.4ML IJ SOSY
40.0000 mg | PREFILLED_SYRINGE | INTRAMUSCULAR | Status: DC
  Administered 2021-04-22 – 2021-04-28 (×7): 40 mg via SUBCUTANEOUS
  Filled 2021-04-22 (×7): qty 0.4

## 2021-04-22 NOTE — H&P (Signed)
History and Physical    Carrie Adkins PHX:[id number] DOB: [birth year] DOA: 04/21/2021  PCP: System, Provider Not In  Patient coming from: Home   Chief Complaint:  Chief Complaint  Patient presents with   Exposure to STD   Leg Pain   Numbness     HPI:    55 year old female with past medical history of COPD, polysubstance abuse, gastroesophageal reflux disease, hypertension, nicotine dependence and diastolic congestive heart failure who presents to [facility] emergency department with multiple complaints including diffuse tingling, unsteady gait and vaginal discharge.  Per my discussion with the patient she explains that she has been having increasing difficulty with balance while ambulating for the past several months.  She states that she is frequently falling as a result.  Patient is additionally complaining of associated generalized tingling of the extremities.  Patient denies headaches, slurred speech or visual changes.  Patient denies back pain, bowel or bladder incontinence.  Patient states that she typically lives in her car and regularly uses cocaine on a daily basis.  She states that as of late she has felt that, "I am no good anyone and I do not want to be a burden on anyone anymore."  Patient states that she has access to "pills" that she she plans on ingesting.  Because of the aforementioned complaints patient eventually [facility] [place] [facility] for evaluation.  Upon evaluation in the emergency department CT head was performed and found to be unremarkable.  Patient reported having recent unprotected sex with new onset vaginal discharge and asked to be checked for STDs and therefore a pelvic exam was performed.  Vitamin B12, vitamin B1, RPR and urinalysis were obtained.  Due to concerns for suicidal ideation as well as progressive weakness with frequent falls and hitting ongoing cocaine use in the hospitalist group has been called to  assess the patient for admission to the hospital.  Review of Systems:   Review of Systems  Musculoskeletal:  Positive for falls.  Neurological:  Positive for weakness.  All other systems reviewed and are negative.  Past Medical History:  Diagnosis Date   COPD without exacerbation (HCC) 03/01/2007   Qualifier: Diagnosis of  By: Beverely Pace MD, Curtis     Coronary artery disease involving native coronary artery of native heart 04/22/2021   Nicotine dependence, cigarettes, uncomplicated 04/22/2021   Polysubstance abuse (HCC) 04/22/2021    History reviewed. No pertinent surgical history.   reports that she has been smoking cigarettes. She has never used smokeless tobacco. She reports that she does not currently use alcohol. She reports current drug use. Drugs: Cocaine, Methamphetamines, and Marijuana.  No Known Allergies  Family History  Problem Relation Age of Onset   Heart disease Neg Hx      Prior to Admission medications   Medication Sig Start Date End Date Taking? Authorizing Provider  ibuprofen (ADVIL) 200 MG tablet Take 400-800 mg by mouth every 6 (six) hours as needed for fever, headache or mild pain.   Yes [provider]    Physical Exam: Vitals:   04/22/21 0800 04/22/21 0830 04/22/21 1000 04/22/21 1015  BP: (!) 146/89 (!) 141/93 (!) 140/98   Pulse: 84 84 82   Resp:   17   Temp:      TempSrc:      SpO2: 99% 99% 97%   Weight:    77.1 kg  Height:    5\' 6"  (1.676 m)    Constitutional: Lethargic but arousable, oriented  x3, no associated distress.   Skin: no rashes, no lesions, good skin turgor noted. Eyes: Pupils are equally reactive to light.  No evidence of scleral icterus or conjunctival pallor.  ENMT: Moist mucous membranes noted.  Posterior pharynx clear of any exudate or lesions.   Neck: normal, supple, no masses, no thyromegaly.  No evidence of jugular venous distension.   Respiratory: clear to auscultation bilaterally, no wheezing, no crackles. Normal  respiratory effort. No accessory muscle use.  Cardiovascular: Regular rate and rhythm, no murmurs / rubs / gallops. No extremity edema. 2+ pedal pulses. No carotid bruits.  Chest:   Nontender without crepitus or deformity.   Back:   Nontender without crepitus or deformity. Abdomen: Abdomen is soft and nontender.  No evidence of intra-abdominal masses.  Positive bowel sounds noted in all quadrants.   Musculoskeletal: No joint deformity upper and lower extremities. Good ROM, no contractures. Normal muscle tone.  Neurologic: CN 2-12 grossly intact. Sensation intact.  Patient moving all 4 extremities spontaneously.  Patient is following all commands.  Patient is responsive to verbal stimuli.   Psychiatric: Patient exhibits a depressed mood with labile affect.  Patient is frequently tearful throughout the interview.  Patient continues to state that she has thoughts of suicidal ideation and is thinking about pursuing a plan involving taking large amounts of medications that she which she would obtain from various sources.  Patient seems to possess insight as to their current situation.     Labs on Admission: I have personally reviewed following labs and imaging studies -   CBC: Recent Labs  Lab 04/21/21 1351  WBC 10.9*  NEUTROABS 6.0  HGB 13.5  HCT 41.0  MCV 90.5  PLT 341   Basic Metabolic Panel: Recent Labs  Lab 04/21/21 1351  NA 143  K 3.2*  CL 109  CO2 26  GLUCOSE 108*  BUN 24*  CREATININE 1.10*  CALCIUM 9.5   GFR: Estimated Creatinine Clearance: 59.1 mL/min (A) (by C-G formula based on SCr of 1.1 mg/dL (H)). Liver Function Tests: Recent Labs  Lab 04/21/21 1351  AST 107*  ALT 43  ALKPHOS 88  BILITOT 0.7  PROT 7.7  ALBUMIN 4.3   No results for input(s): LIPASE, AMYLASE in the last 168 hours. No results for input(s): AMMONIA in the last 168 hours. Coagulation Profile: Recent Labs  Lab 04/21/21 1351  INR 1.0   Cardiac Enzymes: No results for input(s): CKTOTAL,  CKMB, CKMBINDEX, TROPONINI in the last 168 hours. BNP (last 3 results) No results for input(s): PROBNP in the last 8760 hours. HbA1C: No results for input(s): HGBA1C in the last 72 hours. CBG: No results for input(s): GLUCAP in the last 168 hours. Lipid Profile: No results for input(s): CHOL, HDL, LDLCALC, TRIG, CHOLHDL, LDLDIRECT in the last 72 hours. Thyroid Function Tests: No results for input(s): TSH, T4TOTAL, FREET4, T3FREE, THYROIDAB in the last 72 hours. Anemia Panel: Recent Labs    04/22/21 0259  VITAMINB12 285  FOLATE 14.5   Urine analysis:    Component Value Date/Time   COLORURINE YELLOW 04/22/2021 0058   APPEARANCEUR HAZY (A) 04/22/2021 0058   LABSPEC 1.025 04/22/2021 0058   PHURINE 5.0 04/22/2021 0058   GLUCOSEU NEGATIVE 04/22/2021 0058   HGBUR MODERATE (A) 04/22/2021 0058   BILIRUBINUR NEGATIVE 04/22/2021 0058   KETONESUR NEGATIVE 04/22/2021 0058   PROTEINUR NEGATIVE 04/22/2021 0058   UROBILINOGEN 1.0 03/13/2008 0800   NITRITE NEGATIVE 04/22/2021 0058   LEUKOCYTESUR MODERATE (A) 04/22/2021 0058  Radiological Exams on Admission - Personally Reviewed: CT HEAD WO CONTRAST (5MM)  Result Date: 04/21/2021 CLINICAL DATA:  Intermittent numbness and tingling in arms and legs, leg pain EXAM: CT HEAD WITHOUT CONTRAST TECHNIQUE: Contiguous axial images were obtained from the base of the skull through the vertex without intravenous contrast. COMPARISON:  None. FINDINGS: Brain: No evidence of acute infarction, hemorrhage, hydrocephalus, extra-axial collection or mass lesion/mass effect. Incidental cavum vergae variant of the lateral ventricles. Vascular: No hyperdense vessel or unexpected calcification. Skull: Normal. Negative for fracture or focal lesion. Sinuses/Orbits: No acute finding. Other: None. IMPRESSION: No acute intracranial pathology. Electronically Signed   By: Lauralyn PrimesAlex  Bibbey M.D.   On: 04/21/2021 13:50   DG Chest Port 1 View  Result Date: 04/22/2021 CLINICAL DATA:   Cough, chest tightness EXAM: PORTABLE CHEST 1 VIEW COMPARISON:  08/05/2008 FINDINGS: The heart size and mediastinal contours are within normal limits. Both lungs are clear. The visualized skeletal structures are unremarkable. IMPRESSION: No active disease. Electronically Signed   By: Charlett NoseKevin  Dover M.D.   On: 04/22/2021 02:23    EKG: Personally reviewed.  Rhythm is normal sinus rhythm with heart rate of 84 bpm.  No dynamic ST segment changes appreciated.  Assessment/Plan Principal Problem:   Unsteady gait  Patient reports a several month history of worsening gait and frequent falls Patient's symptoms are extremely vague on interview and it is difficult to pinpoint them.  Symptoms that she has reported to me are somewhat different than the symptoms she reported to the emergency department provider. Noncontrast CT of head unremarkable Considering ongoing symptoms will obtain MRI brain without contrast baseline considering longstanding daily cocaine abuse.   PT evaluation ordered TSH, vitamin B12, folate, RPR, thiamine levels, urinalysis, ammonia additionally ordered   Active Problems:    Suicidal ideation  Patient reports ongoing ideation with plan but wishes to receive help for involuntary commitment has not been pursued Will obtain psychiatry consultation once medical work-up completed   COPD without exacerbation (HCC)  No evidence of acute exacerbation As needed bronchodilator therapy for shortness of breath and wheezing    Nicotine dependence, cigarettes, uncomplicated   Counseling on cessation     Polysubstance abuse (HCC)  Daily use of cocaine with intermittent use of THC and methamphetamines Counseling on cessation of substances Case management consultation placed No evidence of withdrawal at this time    Coronary artery disease involving native coronary artery of native heart  Unable to review cardiac catheterization report [facility]  05/2019 Patient currently not receiving any therapy Obtaining lipid panel Initiating daily aspirin therapy Patient may benefit from daily beta-blocker once 24 to 48 hours past last cocaine use    Bacterial vaginosis  Pelvic exam performed in emergency department due to reports of unprotected sex and ongoing vaginal discharge Wet prep revealing clue cells concerning for bacterial vaginosis 7 day course of metronidazole twice daily initiated    Code Status:  Full code  code status decision has been confirmed with: patient Family Communication: deferred   Status is: Observation  The patient remains OBS appropriate and will d/c before 2 midnights.  Dispo: The patient is from: Home              Anticipated d/c is to: Home              Patient currently is not medically stable to d/c.   Difficult to place patient No        Marinda ElkGeorge J Nailani Full MD  [facility] Pager [phone]  If 7PM-7AM, please contact night-coverage [email or web address] Use universal [place] password for that web site. If you do not have the password, please call the hospital operator.  04/22/2021, 10:29 AM

## 2021-04-22 NOTE — ED Notes (Signed)
Pt. Sleeping and no breathing difficulties. Sitter at bedside.

## 2021-04-22 NOTE — ED Notes (Signed)
Pt dressed out, belongings placed in 16-18 pt belongings cabinet.

## 2021-04-22 NOTE — Plan of Care (Signed)
  Problem: Education: Goal: Ability to make informed decisions regarding treatment will improve Outcome: Progressing   Problem: Coping: Goal: Coping ability will improve Outcome: Progressing   Problem: Health Behavior/Discharge Planning: Goal: Identification of resources available to assist in meeting health care needs will improve Outcome: Progressing   Problem: Medication: Goal: Compliance with prescribed medication regimen will improve Outcome: Progressing   Problem: Self-Concept: Goal: Ability to disclose and discuss suicidal ideas will improve Outcome: Progressing Goal: Will verbalize positive feelings about self Outcome: Progressing   

## 2021-04-22 NOTE — Progress Notes (Signed)
This is a 55 year old female with history of polysubstance abuse COPD nicotine dependence diastolic heart failure hypertension admitted with multiple complaints including numbness confusion with general discharge generalized weakness and suicidal ideations.  Review of her chart indicates her urine drug screen is positive for cocaine amphetamine and THC.  MRI of the brain shows no acute findings though meningioma may be noted.  This will need outpatient follow-up with contrast MRI.  I have also consulted psych for suicidal ideations. Labs reviewed with hyperkalemia and mildly elevated creatinine compared to yesterday we will start her on IV fluids.  Follow-up B12 folate RPR ammonia B1 levels and TSH.
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
## 2021-04-22 NOTE — ED Notes (Signed)
Patients sister would like a call back [phone] Delfino Lovett

## 2021-04-23 DIAGNOSIS — R4182 Altered mental status, unspecified: Secondary | ICD-10-CM | POA: Diagnosis not present

## 2021-04-23 DIAGNOSIS — G4700 Insomnia, unspecified: Secondary | ICD-10-CM | POA: Diagnosis present

## 2021-04-23 DIAGNOSIS — F1910 Other psychoactive substance abuse, uncomplicated: Secondary | ICD-10-CM | POA: Diagnosis present

## 2021-04-23 DIAGNOSIS — R200 Anesthesia of skin: Secondary | ICD-10-CM | POA: Diagnosis present

## 2021-04-23 DIAGNOSIS — F17210 Nicotine dependence, cigarettes, uncomplicated: Secondary | ICD-10-CM | POA: Diagnosis present

## 2021-04-23 DIAGNOSIS — I110 Hypertensive heart disease with heart failure: Secondary | ICD-10-CM | POA: Diagnosis present

## 2021-04-23 DIAGNOSIS — R112 Nausea with vomiting, unspecified: Secondary | ICD-10-CM | POA: Diagnosis present

## 2021-04-23 DIAGNOSIS — R45851 Suicidal ideations: Secondary | ICD-10-CM | POA: Diagnosis present

## 2021-04-23 DIAGNOSIS — R296 Repeated falls: Secondary | ICD-10-CM | POA: Diagnosis present

## 2021-04-23 DIAGNOSIS — F32A Depression, unspecified: Secondary | ICD-10-CM | POA: Diagnosis present

## 2021-04-23 DIAGNOSIS — R7401 Elevation of levels of liver transaminase levels: Secondary | ICD-10-CM | POA: Diagnosis present

## 2021-04-23 DIAGNOSIS — R2681 Unsteadiness on feet: Principal | ICD-10-CM

## 2021-04-23 DIAGNOSIS — R9431 Abnormal electrocardiogram [ECG] [EKG]: Secondary | ICD-10-CM | POA: Diagnosis not present

## 2021-04-23 DIAGNOSIS — F331 Major depressive disorder, recurrent, moderate: Secondary | ICD-10-CM | POA: Diagnosis not present

## 2021-04-23 DIAGNOSIS — F1210 Cannabis abuse, uncomplicated: Secondary | ICD-10-CM | POA: Diagnosis present

## 2021-04-23 DIAGNOSIS — E876 Hypokalemia: Secondary | ICD-10-CM | POA: Diagnosis present

## 2021-04-23 DIAGNOSIS — Z5902 Unsheltered homelessness: Secondary | ICD-10-CM | POA: Diagnosis not present

## 2021-04-23 DIAGNOSIS — R202 Paresthesia of skin: Secondary | ICD-10-CM | POA: Diagnosis present

## 2021-04-23 DIAGNOSIS — Z202 Contact with and (suspected) exposure to infections with a predominantly sexual mode of transmission: Secondary | ICD-10-CM | POA: Diagnosis present

## 2021-04-23 DIAGNOSIS — K219 Gastro-esophageal reflux disease without esophagitis: Secondary | ICD-10-CM | POA: Diagnosis present

## 2021-04-23 DIAGNOSIS — N760 Acute vaginitis: Secondary | ICD-10-CM | POA: Diagnosis present

## 2021-04-23 DIAGNOSIS — I5032 Chronic diastolic (congestive) heart failure: Secondary | ICD-10-CM | POA: Diagnosis present

## 2021-04-23 DIAGNOSIS — Z20822 Contact with and (suspected) exposure to covid-19: Secondary | ICD-10-CM | POA: Diagnosis present

## 2021-04-23 DIAGNOSIS — R001 Bradycardia, unspecified: Secondary | ICD-10-CM | POA: Diagnosis present

## 2021-04-23 DIAGNOSIS — F1410 Cocaine abuse, uncomplicated: Secondary | ICD-10-CM | POA: Diagnosis present

## 2021-04-23 DIAGNOSIS — I2510 Atherosclerotic heart disease of native coronary artery without angina pectoris: Secondary | ICD-10-CM | POA: Diagnosis present

## 2021-04-23 DIAGNOSIS — F419 Anxiety disorder, unspecified: Secondary | ICD-10-CM | POA: Diagnosis present

## 2021-04-23 DIAGNOSIS — J449 Chronic obstructive pulmonary disease, unspecified: Secondary | ICD-10-CM | POA: Diagnosis present

## 2021-04-23 LAB — COMPREHENSIVE METABOLIC PANEL
ALT: 27 U/L (ref 0–44)
AST: 39 U/L (ref 15–41)
Albumin: 3.5 g/dL (ref 3.5–5.0)
Alkaline Phosphatase: 70 U/L (ref 38–126)
Anion gap: 5 (ref 5–15)
BUN: 17 mg/dL (ref 6–20)
CO2: 27 mmol/L (ref 22–32)
Calcium: 8.4 mg/dL — ABNORMAL LOW (ref 8.9–10.3)
Chloride: 108 mmol/L (ref 98–111)
Creatinine, Ser: 0.87 mg/dL (ref 0.44–1.00)
GFR, Estimated: >60 mL/min (ref >60)
Glucose, Bld: 103 mg/dL — ABNORMAL HIGH (ref 70–99)
Potassium: 3.7 mmol/L (ref 3.5–5.1)
Sodium: 140 mmol/L (ref 135–145)
Total Bilirubin: 0.2 mg/dL — ABNORMAL LOW (ref 0.3–1.2)
Total Protein: 6.3 g/dL — ABNORMAL LOW (ref 6.5–8.1)

## 2021-04-23 LAB — CBC
HCT: 38.9 % (ref 36.0–46.0)
Hemoglobin: 12.9 g/dL (ref 12.0–15.0)
MCH: 30.4 pg (ref 26.0–34.0)
MCHC: 33.2 g/dL (ref 30.0–36.0)
MCV: 91.5 fL (ref 80.0–100.0)
Platelets: 312 10*3/uL (ref 150–400)
RBC: 4.25 MIL/uL (ref 3.87–5.11)
RDW: 14.2 % (ref 11.5–15.5)
WBC: 5.7 10*3/uL (ref 4.0–10.5)
nRBC: 0 % (ref 0.0–0.2)

## 2021-04-23 MED ORDER — ATORVASTATIN CALCIUM 10 MG PO TABS
10.0000 mg | ORAL_TABLET | Freq: Every day | ORAL | Status: DC
  Administered 2021-04-23 – 2021-04-28 (×6): 10 mg via ORAL
  Filled 2021-04-23 (×6): qty 1

## 2021-04-23 MED ORDER — GABAPENTIN 100 MG PO CAPS
100.0000 mg | ORAL_CAPSULE | Freq: Two times a day (BID) | ORAL | Status: DC
  Administered 2021-04-23 – 2021-04-28 (×11): 100 mg via ORAL
  Filled 2021-04-23 (×11): qty 1

## 2021-04-23 NOTE — Progress Notes (Signed)
Pt's older sister, Amil Amen, is visiting pt. Will continue to monitor pt.

## 2021-04-23 NOTE — Plan of Care (Signed)
°  Problem: Education: °Goal: Ability to make informed decisions regarding treatment will improve °Outcome: Progressing °  °Problem: Coping: °Goal: Coping ability will improve °Outcome: Progressing °  °

## 2021-04-23 NOTE — Consult Note (Signed)
[facility] Face-to-Face Psychiatry Consult   Reason for Consult:  SUicidal ideation Referring Physician:  Dr. Molli Hazard Patient Identification: Carrie Adkins MRN:  [id number] Principal Diagnosis: Unsteady gait Diagnosis:  Principal Problem:   Unsteady gait Active Problems:   COPD without exacerbation (HCC)   GERD without esophagitis   Nicotine dependence, cigarettes, uncomplicated   Suicidal ideation   Polysubstance abuse (HCC)   Coronary artery disease involving native coronary artery of native heart   Bacterial vaginosis   Total Time spent with patient: 45 minutes  Subjective:   Carrie Adkins is a 55 y.o. female patient admitted with vaginal discharge and generalized weakness.  Patient endorsed suicidal ideations to her provider yesterday, which prompted psych consult.  Patient is seen and observed to be resting at bedside, her Sister Amil Amen is present.  Consent is obtained to speak with patient regarding psychiatric concerns, in the presence of her sister.  Patient endorses recent relapse after 8 years sober on crack cocaine.  She states she has nowhere to go, feels worthless, hopeless, and has been suicidal.  She states she recently tried meth for the first time, to see if she could end her life."  I know that sometimes that crystal meth is cut with fentanyl and heroin.  So I tried it again it would help because some people use it and never wake up.  And when I woke up I was like Damn that shit didn't work- either, supposed to be here."  Patient denies any current outpatient psychiatric services, prior to this admission she was residing in [facility] where she was seeing outpatient providers.  She denies any recent or current inpatient hospitalization.  Sh endorses pending court charges in October 2022, for broken taillight and driving without insurance.  When asking patient what we can do for her she states " if I get discharged and is staying on me in I am just going to  go back out and use, and be right back here.  She says and I am most likely going to attempt again. "  Patient continues to endorse ongoing suicidal ideations with no plan.  She is unable to contract for safety at this time.  Patient will likely benefit from inpatient psychiatric admission for acute crisis stabilization, medication management, and therapeutic services.  HPI:  This is a 56 year old female with history of polysubstance abuse COPD nicotine dependence diastolic heart failure hypertension admitted with multiple complaints including numbness confusion with general discharge generalized weakness and suicidal ideations.  Review of her chart indicates her urine drug screen is positive for cocaine amphetamine and THC.  MRI of the brain shows no acute findings though meningioma may be noted.  This will need outpatient follow-up with contrast MRI.  I have also consulted psych for suicidal ideations.  Past Psychiatric History: Depression and anxiety  Risk to Self: Yes Risk to Others: Denies Prior Inpatient Therapy: Denies Prior Outpatient Therapy: Denies  Past Medical History:  Past Medical History:  Diagnosis Date   COPD without exacerbation (HCC) 03/01/2007   Qualifier: Diagnosis of  By: Beverely Pace MD, Curtis     Coronary artery disease involving native coronary artery of native heart 04/22/2021   Nicotine dependence, cigarettes, uncomplicated 04/22/2021   Polysubstance abuse (HCC) 04/22/2021   History reviewed. No pertinent surgical history. Family History:  Family History  Problem Relation Age of Onset   Heart disease Neg Hx    Family Psychiatric  History: Denies by both patient and her sister. Social History:  Social History   Substance and Sexual Activity  Alcohol Use Not Currently     Social History   Substance and Sexual Activity  Drug Use Yes   Types: Cocaine, Methamphetamines, Marijuana    Social History   Socioeconomic History   Marital status: Legally Separated     Spouse name: Not on file   Number of children: Not on file   Years of education: Not on file   Highest education level: Not on file  Occupational History   Not on file  Tobacco Use   Smoking status: Every Day    Types: Cigarettes   Smokeless tobacco: Never  Vaping Use   Vaping Use: Never used  Substance and Sexual Activity   Alcohol use: Not Currently   Drug use: Yes    Types: Cocaine, Methamphetamines, Marijuana   Sexual activity: Yes    Birth control/protection: None  Other Topics Concern   Not on file  Social History Narrative   Not on file   Social Determinants of Health   Financial Resource Strain: Not on file  Food Insecurity: Not on file  Transportation Needs: Not on file  Physical Activity: Not on file  Stress: Not on file  Social Connections: Not on file   Additional Social History:    Allergies:  No Known Allergies  Labs:  Results for orders placed or performed during the hospital encounter of 04/21/21 (from the past 48 hour(s))  Protime-INR     Status: None   Collection Time: 04/21/21  1:51 PM  Result Value Ref Range   Prothrombin Time 12.9 11.4 - 15.2 seconds   INR 1.0 0.8 - 1.2    Comment: (NOTE) INR goal varies based on device and disease states. Performed at [facility], [address] [place], [place] [address]   APTT     Status: None   Collection Time: 04/21/21  1:51 PM  Result Value Ref Range   aPTT 27 24 - 36 seconds    Comment: Performed at [facility], [address] [place], [place] [address]  CBC     Status: Abnormal   Collection Time: 04/21/21  1:51 PM  Result Value Ref Range   WBC 10.9 (H) 4.0 - 10.5 K/uL   RBC 4.53 3.87 - 5.11 MIL/uL   Hemoglobin 13.5 12.0 - 15.0 g/dL   HCT 26.9 48.5 - 46.2 %   MCV 90.5 80.0 - 100.0 fL   MCH 29.8 26.0 - 34.0 pg   MCHC 32.9 30.0 - 36.0 g/dL   RDW 70.3 50.0 - 93.8 %   Platelets 341 150 - 400 K/uL   nRBC 0.0 0.0 - 0.2 %    Comment: Performed at [facility], [address] [place], [place] [address]  Differential     Status: None   Collection Time: 04/21/21  1:51 PM  Result Value Ref Range   Neutrophils Relative % 55 %   Neutro Abs 6.0 1.7 - 7.7 K/uL   Lymphocytes Relative 35 %   Lymphs Abs 3.8 0.7 - 4.0 K/uL   Monocytes Relative 7 %   Monocytes Absolute 0.8 0.1 - 1.0 K/uL   Eosinophils Relative 2 %   Eosinophils Absolute 0.2 0.0 - 0.5 K/uL   Basophils Relative 1 %   Basophils Absolute 0.1 0.0 - 0.1 K/uL   Immature Granulocytes 0 %   Abs Immature Granulocytes 0.02 0.00 - 0.07 K/uL    Comment: Performed at [facility], [address] [place], [place] [address]  Comprehensive metabolic panel     Status: Abnormal   Collection Time: 04/21/21  1:51 PM  Result Value Ref Range   Sodium 143 135 - 145 mmol/L   Potassium 3.2 (L) 3.5 - 5.1 mmol/L   Chloride 109 98 - 111 mmol/L   CO2 26 22 - 32 mmol/L   Glucose, Bld 108 (H) 70 - 99 mg/dL    Comment: Glucose reference range applies only to samples taken after fasting for at least 8 hours.   BUN 24 (H) 6 - 20 mg/dL   Creatinine, Ser 6.04 (H) 0.44 - 1.00 mg/dL   Calcium 9.5 8.9 - 54.0 mg/dL   Total Protein 7.7 6.5 - 8.1 g/dL   Albumin 4.3 3.5 - 5.0 g/dL   AST 981 (H) 15 - 41 U/L   ALT 43 0 - 44 U/L   Alkaline Phosphatase 88 38 - 126 U/L   Total Bilirubin 0.7 0.3 - 1.2 mg/dL   GFR, Estimated 59 (L) >60 mL/min    Comment: (NOTE) Calculated using the CKD-EPI Creatinine Equation (2021)    Anion gap 8 5 - 15    Comment: Performed at [facility], [address] [place], [place] [address]  Ethanol     Status: None   Collection Time: 04/21/21  1:51 PM  Result Value Ref Range   Alcohol, Ethyl (B) <10 <10 mg/dL    Comment: (NOTE) Lowest detectable limit for serum alcohol is 10 mg/dL.  For medical purposes only. Performed at [facility], [address], [place], [place] [address]   GC/Chlamydia probe amp      Status: None   Collection Time: 04/22/21 12:54 AM  Result Value Ref Range   Chlamydia Negative    Neisseria Gonorrhea Negative    Comment Normal Reference Ranger Chlamydia - Negative    Comment      Normal Reference Range Neisseria Gonorrhea - Negative  Urinalysis, Routine w reflex microscopic Urine, Clean Catch     Status: Abnormal   Collection Time: 04/22/21 12:58 AM  Result Value Ref Range   Color, Urine YELLOW YELLOW   APPearance HAZY (A) CLEAR   Specific Gravity, Urine 1.025 1.005 - 1.030   pH 5.0 5.0 - 8.0   Glucose, UA NEGATIVE NEGATIVE mg/dL   Hgb urine dipstick MODERATE (A) NEGATIVE   Bilirubin Urine NEGATIVE NEGATIVE   Ketones, ur NEGATIVE NEGATIVE mg/dL   Protein, ur NEGATIVE NEGATIVE mg/dL   Nitrite NEGATIVE NEGATIVE   Leukocytes,Ua MODERATE (A) NEGATIVE   RBC / HPF 11-20 0 - 5 RBC/hpf   WBC, UA 11-20 0 - 5 WBC/hpf   Bacteria, UA RARE (A) NONE SEEN   Squamous Epithelial / LPF 0-5 0 - 5   Mucus PRESENT    Hyaline Casts, UA PRESENT     Comment: Performed at [facility], [address], [place], [place] [address]  Rapid urine drug screen (hospital performed)     Status: Abnormal   Collection Time: 04/22/21 12:58 AM  Result Value Ref Range   Opiates NONE DETECTED NONE DETECTED   Cocaine POSITIVE (A) NONE DETECTED   Benzodiazepines NONE DETECTED NONE DETECTED   Amphetamines POSITIVE (A) NONE DETECTED   Tetrahydrocannabinol POSITIVE (A) NONE DETECTED   Barbiturates NONE DETECTED NONE DETECTED    Comment: (NOTE) DRUG SCREEN FOR MEDICAL PURPOSES ONLY.  IF CONFIRMATION IS NEEDED FOR ANY PURPOSE, NOTIFY LAB WITHIN 5 DAYS.  LOWEST DETECTABLE  LIMITS FOR URINE DRUG SCREEN Drug Class                     Cutoff (ng/mL) Amphetamine and metabolites    1000 Barbiturate and metabolites    200 Benzodiazepine                 200 Tricyclics and metabolites     300 Opiates and metabolites        300 Cocaine and metabolites        300 THC                             50 Performed at [facility], [address] [place], [place] [address]   RPR     Status: None   Collection Time: 04/22/21 12:58 AM  Result Value Ref Range   RPR Ser Ql NON REACTIVE NON REACTIVE    Comment: Performed at [facility], [address], [place], [place] [address]  Wet prep, genital     Status: Abnormal   Collection Time: 04/22/21 12:58 AM   Specimen: Urine, Clean Catch  Result Value Ref Range   Yeast Wet Prep HPF POC NONE SEEN NONE SEEN   Trich, Wet Prep NONE SEEN NONE SEEN   Clue Cells Wet Prep HPF POC PRESENT (A) NONE SEEN   WBC, Wet Prep HPF POC NONE SEEN NONE SEEN   Sperm NONE SEEN     Comment: Performed at [facility], [address] [place], [place] [address]  HIV Antibody (routine testing w rflx)     Status: None   Collection Time: 04/22/21 12:58 AM  Result Value Ref Range   HIV Screen 4th Generation wRfx Non Reactive Non Reactive    Comment: Performed at [facility], [address], [place], [place] [address]  Folate, serum, performed at [facility] lab     Status: None   Collection Time: 04/22/21  2:59 AM  Result Value Ref Range   Folate 14.5 >5.9 ng/mL    Comment: Performed at [facility], [address] [place], [place] [address]  Vitamin B12     Status: None   Collection Time: 04/22/21  2:59 AM  Result Value Ref Range   Vitamin B-12 285 180 - 914 pg/mL    Comment: (NOTE) This assay is not validated for testing neonatal or myeloproliferative syndrome specimens for Vitamin B12 levels. Performed at [facility], [address], [place], [place] [address]   Resp Panel by RT-PCR (Flu A&B, Covid) Nasopharyngeal Swab     Status: None   Collection Time: 04/22/21  5:28 AM   Specimen: Nasopharyngeal Swab; Nasopharyngeal(NP) swabs in vial transport medium  Result Value Ref Range   SARS Coronavirus 2 by RT PCR NEGATIVE NEGATIVE    Comment: (NOTE) SARS-CoV-2  target nucleic acids are NOT DETECTED.  The SARS-CoV-2 RNA is generally detectable in upper respiratory specimens during the acute phase of infection. The lowest concentration of SARS-CoV-2 viral copies this assay can detect is 138 copies/mL. A negative result does not preclude SARS-Cov-2 infection and should not be used as the sole basis for treatment or other patient management decisions. A negative result may occur with  improper specimen collection/handling, submission of specimen other than nasopharyngeal swab, presence of viral mutation(s) within the areas targeted by this assay, and inadequate number of viral copies(<138 copies/mL). A negative result  must be combined with clinical observations, patient history, and epidemiological information. The expected result is Negative.  Fact Sheet for Patients:  BloggerCourse.com  Fact Sheet for Healthcare Providers:  SeriousBroker.it  This test is no t yet approved or cleared by the [place] FDA and  has been authorized for detection and/or diagnosis of SARS-CoV-2 by FDA under an Emergency Use Authorization (EUA). This EUA will remain  in effect (meaning this test can be used) for the duration of the COVID-19 declaration under Section 564(b)(1) of the Act, 21 U.S.C.section 360bbb-3(b)(1), unless the authorization is terminated  or revoked sooner.       Influenza A by PCR NEGATIVE NEGATIVE   Influenza B by PCR NEGATIVE NEGATIVE    Comment: (NOTE) The Xpert Xpress SARS-CoV-2/FLU/RSV plus assay is intended as an aid in the diagnosis of influenza from Nasopharyngeal swab specimens and should not be used as a sole basis for treatment. Nasal washings and aspirates are unacceptable for Xpert Xpress SARS-CoV-2/FLU/RSV testing.  Fact Sheet for Patients: BloggerCourse.com  Fact Sheet for Healthcare Providers: SeriousBroker.it  This  test is not yet approved or cleared by the [place] FDA and has been authorized for detection and/or diagnosis of SARS-CoV-2 by FDA under an Emergency Use Authorization (EUA). This EUA will remain in effect (meaning this test can be used) for the duration of the COVID-19 declaration under Section 564(b)(1) of the Act, 21 U.S.C. section 360bbb-3(b)(1), unless the authorization is terminated or revoked.  Performed at [facility], [address], [place], [place] [address]   Comprehensive metabolic panel     Status: Abnormal   Collection Time: 04/22/21 10:30 AM  Result Value Ref Range   Sodium 141 135 - 145 mmol/L   Potassium 5.8 (H) 3.5 - 5.1 mmol/L    Comment: DELTA CHECK NOTED SPECIMEN HEMOLYZED. HEMOLYSIS MAY AFFECT INTEGRITY OF RESULTS.    Chloride 105 98 - 111 mmol/L   CO2 24 22 - 32 mmol/L   Glucose, Bld 85 70 - 99 mg/dL    Comment: Glucose reference range applies only to samples taken after fasting for at least 8 hours.   BUN 16 6 - 20 mg/dL   Creatinine, Ser 7.59 (H) 0.44 - 1.00 mg/dL   Calcium 9.0 8.9 - 16.3 mg/dL   Total Protein 7.3 6.5 - 8.1 g/dL   Albumin 3.8 3.5 - 5.0 g/dL   AST 87 (H) 15 - 41 U/L   ALT 36 0 - 44 U/L   Alkaline Phosphatase 78 38 - 126 U/L   Total Bilirubin 1.2 0.3 - 1.2 mg/dL   GFR, Estimated >84 >66 mL/min    Comment: (NOTE) Calculated using the CKD-EPI Creatinine Equation (2021)    Anion gap 12 5 - 15    Comment: Performed at [facility], [address], [place], [place] [address]  Magnesium     Status: None   Collection Time: 04/22/21 10:30 AM  Result Value Ref Range   Magnesium 2.4 1.7 - 2.4 mg/dL    Comment: Performed at [facility], [address], [place], [place] [address]  CBC WITH DIFFERENTIAL     Status: None   Collection Time: 04/22/21 10:30 AM  Result Value Ref Range   WBC 6.4 4.0 - 10.5 K/uL   RBC 4.66 3.87 - 5.11 MIL/uL   Hemoglobin 14.2 12.0 - 15.0 g/dL   HCT  93.9 03.0 - 09.2 %   MCV 92.3 80.0 - 100.0 fL   MCH  30.5 26.0 - 34.0 pg   MCHC 33.0 30.0 - 36.0 g/dL   RDW 16.1 09.6 - 04.5 %   Platelets 287 150 - 400 K/uL   nRBC 0.0 0.0 - 0.2 %   Neutrophils Relative % 53 %   Neutro Abs 3.4 1.7 - 7.7 K/uL   Lymphocytes Relative 37 %   Lymphs Abs 2.4 0.7 - 4.0 K/uL   Monocytes Relative 7 %   Monocytes Absolute 0.4 0.1 - 1.0 K/uL   Eosinophils Relative 3 %   Eosinophils Absolute 0.2 0.0 - 0.5 K/uL   Basophils Relative 0 %   Basophils Absolute 0.0 0.0 - 0.1 K/uL   Immature Granulocytes 0 %   Abs Immature Granulocytes 0.02 0.00 - 0.07 K/uL    Comment: Performed at [facility], [address] [place], [place] [address]  Hemoglobin A1c     Status: None   Collection Time: 04/22/21 10:30 AM  Result Value Ref Range   Hgb A1c MFr Bld 5.4 4.8 - 5.6 %    Comment: (NOTE) Pre diabetes:          5.7%-6.4%  Diabetes:              >6.4%  Glycemic control for   <7.0% adults with diabetes    Mean Plasma Glucose 108.28 mg/dL    Comment: Performed at [facility], [address], [place], [place] [address]  Lipid panel     Status: Abnormal   Collection Time: 04/22/21 10:30 AM  Result Value Ref Range   Cholesterol 194 0 - 200 mg/dL   Triglycerides 829 <562 mg/dL   HDL 48 >13 mg/dL   Total CHOL/HDL Ratio 4.0 RATIO   VLDL 21 0 - 40 mg/dL   LDL Cholesterol 086 (H) 0 - 99 mg/dL    Comment:        Total Cholesterol/HDL:CHD Risk Coronary Heart Disease Risk Table                     Men   Women  1/2 Average Risk   3.4   3.3  Average Risk       5.0   4.4  2 X Average Risk   9.6   7.1  3 X Average Risk  23.4   11.0        Use the calculated Patient Ratio above and the CHD Risk Table to determine the patient's CHD Risk.        ATP III CLASSIFICATION (LDL):  <100     mg/dL   Optimal  578-469  mg/dL   Near or Above                    Optimal  130-159  mg/dL   Borderline  629-528  mg/dL   High  >413     mg/dL   Very  High Performed at [facility], [address], [place], [place] [address]   TSH     Status: Abnormal   Collection Time: 04/22/21 11:13 AM  Result Value Ref Range   TSH 0.294 (L) 0.350 - 4.500 uIU/mL    Comment: Performed by a 3rd Generation assay with a functional sensitivity of <=0.01 uIU/mL. Performed at [facility], [address] [place], [place] [address]   Ammonia     Status: None   Collection Time: 04/22/21 11:13 AM  Result Value Ref Range   Ammonia 34 9 - 35  umol/L    Comment: Performed at [facility], [address] [place], [place] [address]  CBC     Status: None   Collection Time: 04/23/21  7:47 AM  Result Value Ref Range   WBC 5.7 4.0 - 10.5 K/uL   RBC 4.25 3.87 - 5.11 MIL/uL   Hemoglobin 12.9 12.0 - 15.0 g/dL   HCT 60.4 54.0 - 98.1 %   MCV 91.5 80.0 - 100.0 fL   MCH 30.4 26.0 - 34.0 pg   MCHC 33.2 30.0 - 36.0 g/dL   RDW 19.1 47.8 - 29.5 %   Platelets 312 150 - 400 K/uL   nRBC 0.0 0.0 - 0.2 %    Comment: Performed at [facility] [place], [address] [place], [place] [address]  Comprehensive metabolic panel     Status: Abnormal   Collection Time: 04/23/21  7:47 AM  Result Value Ref Range   Sodium 140 135 - 145 mmol/L   Potassium 3.7 3.5 - 5.1 mmol/L    Comment: DELTA CHECK NOTED   Chloride 108 98 - 111 mmol/L   CO2 27 22 - 32 mmol/L   Glucose, Bld 103 (H) 70 - 99 mg/dL    Comment: Glucose reference range applies only to samples taken after fasting for at least 8 hours.   BUN 17 6 - 20 mg/dL   Creatinine, Ser 8.65 0.44 - 1.00 mg/dL   Calcium 8.4 (L) 8.9 - 10.3 mg/dL   Total Protein 6.3 (L) 6.5 - 8.1 g/dL   Albumin 3.5 3.5 - 5.0 g/dL   AST 39 15 - 41 U/L   ALT 27 0 - 44 U/L   Alkaline Phosphatase 70 38 - 126 U/L   Total Bilirubin 0.2 (L) 0.3 - 1.2 mg/dL   GFR, Estimated >78 >46 mL/min    Comment: (NOTE) Calculated using the CKD-EPI Creatinine Equation (2021)    Anion gap 5 5 - 15     Comment: Performed at [facility], [address], [place], [place] [address]    Current Facility-Administered Medications  Medication Dose Route Frequency Provider Last Rate Last Admin   0.9 %  sodium chloride infusion   Intravenous Continuous Alwyn Ren, MD 100 mL/hr at 04/22/21 2138 New Bag at 04/22/21 2138   acetaminophen (TYLENOL) tablet 650 mg  650 mg Oral Q6H PRN Marinda Elk, MD       Or   acetaminophen (TYLENOL) suppository 650 mg  650 mg Rectal Q6H PRN Shalhoub, Deno Lunger, MD       aspirin EC tablet 81 mg  81 mg Oral Daily Marinda Elk, MD   81 mg at 04/23/21 0843   enoxaparin (LOVENOX) injection 40 mg  40 mg Subcutaneous Q24H Marinda Elk, MD   40 mg at 04/23/21 0905   metroNIDAZOLE (FLAGYL) tablet 500 mg  500 mg Oral Q12H Shalhoub, Deno Lunger, MD   500 mg at 04/23/21 0844   ondansetron (ZOFRAN) tablet 4 mg  4 mg Oral Q6H PRN Marinda Elk, MD       Or   ondansetron [facility]) injection 4 mg  4 mg Intravenous Q6H PRN Shalhoub, Deno Lunger, MD       polyethylene glycol (MIRALAX / GLYCOLAX) packet 17 g  17 g Oral Daily PRN Shalhoub, Deno Lunger, MD        Musculoskeletal: Strength & Muscle Tone: within normal limits Gait & Station: normal Patient leans: N/A  Psychiatric Specialty Exam:  Presentation  General Appearance: Appropriate for Environment; Disheveled  Eye Contact:Fair  Speech:Clear and Coherent; Slow  Speech Volume:Decreased  Handedness:Right   Mood and Affect  Mood:Depressed; Dysphoric; Hopeless; Worthless  Affect:Depressed; Flat; Restricted   Thought Process  Thought Processes:Coherent; Goal Directed  Descriptions of Associations:Intact  Orientation:Full (Time, Place and Person)  Thought Content:WDL  History of Schizophrenia/Schizoaffective disorder:No data recorded Duration of Psychotic Symptoms:No data recorded Hallucinations:Hallucinations: None  Ideas of  Reference:None  Suicidal Thoughts:Suicidal Thoughts: No  Homicidal Thoughts:Homicidal Thoughts: No   Sensorium  Memory:Immediate Good; Remote Good; Recent Good  Judgment:Good  Insight:Good   Executive Functions  Concentration:Good  Attention Span:Good  Recall:Good  Fund of Knowledge:Good  Language:Good   Psychomotor Activity  Psychomotor Activity:Psychomotor Activity: Normal   Assets  Assets:Communication Skills; Leisure Time; Desire for Improvement; Physical Health; Financial Resources/Insurance   Sleep  Sleep:Sleep: Fair   Physical Exam: Physical Exam ROS Blood pressure (!) 153/91, pulse 67, temperature 98 F (36.7 C), temperature source Oral, resp. rate 20, height 5\' 6"  (1.676 m), weight 77.1 kg, SpO2 100 %. Body mass index is 27.44 kg/m.  Treatment Plan Summary: Daily contact with patient to assess and evaluate symptoms and progress in treatment, Medication management, and Plan will recommend inpatient psychiatric admission once medically stable.   -Continue to monitor patient closely for substance use withdrawal symptoms.  Please expect agitation, likely verbal aggression, and mood lability as patient begins to withdraw. -Continue one-to-one safety sitter at this time as patient is unable to contract for safety. -Will start gabapentin 100 mg p.o. twice daily Disposition: Recommend psychiatric Inpatient admission when medically cleared.  Maryagnes Amosakia S Starkes-Perry, FNP 04/23/2021 1:05 PM

## 2021-04-23 NOTE — Progress Notes (Signed)
PROGRESS NOTE    Carrie Adkins  EPP:[id number] DOB: 05-25-1966 DOA: 04/21/2021 PCP: System, Provider Not In   Brief Narrative: 55 year old female with history of polysubstance abuse, diastolic heart failure, hypertension, COPD, GERD, admitted with multiple complaints generalized weakness diffuse tingling unsteady gait and vaginal discharge.  She lives in a car and uses cocaine on a regular basis.  She has 4 adult children, none that lives with her.  She has 2 sisters that are involved in her care.  She reports that if she was not here she would have killed herself. She also reports having unprotected sex with new vaginal discharge positive for clue cells, bacterial vaginosis.  She is admitted for active suicidal ideations and plans and for psych consult.  Assessment & Plan:   Principal Problem:   Unsteady gait Active Problems:   COPD without exacerbation (HCC)   GERD without esophagitis   Nicotine dependence, cigarettes, uncomplicated   Suicidal ideation   Polysubstance abuse (HCC)   Coronary artery disease involving native coronary artery of native heart   Bacterial vaginosis  #1 suicidal ideation appreciate psych input.  Started on gabapentin 100 mg bid Continue one-to-one sitter Psych recommends inpatient psych admission when cleared medically.  #2 unsteady gait with frequent falls MRI of the brain does not show any acute findings. PT eval ordered. Further work-up includes TSH 0.294 A1c 5.4 B12 285 folate 14.5 RPR nonreactive COVID-negative HIV nonreactive  #3 history of COPD without exacerbation stable #4 polysubstance abuse with urine drug screen positive for cocaine amphetamines and THC  #5 CAD with history of diastolic heart failure stable, DC IV fluids  #7 bacterial vaginosis on 7-day course of metronidazole bid Wet prep clue cells  #8 ELEVATED LDL-Start lipitor   #9 elevated AST resolved  Estimated body mass index is 27.44 kg/m as calculated from the  following:   Height as of this encounter: 5\' 6"  (1.676 m).   Weight as of this encounter: 77.1 kg.  DVT prophylaxis: lovenox Code Status: full Family Communication: dw sisters  Disposition Plan:  Status is: inpatient  The patient will require care spanning > 2 midnights and should be moved to inpatient because: Altered mental status  Dispo: The patient is from: Home              Anticipated d/c is to:  [facility]              Patient currently is not medically stable to d/c.   Difficult to place patient No   Consultants:  PSYCH  Procedures: NONE Antimicrobials:FLAGYL  Subjective: She continues to report suicidal ideations Denies shortness of breath or chest pain  Objective: Vitals:   04/22/21 2036 04/23/21 0500 04/23/21 1001 04/23/21 1248  BP: 131/78 (!) 163/99 139/88 (!) 153/91  Pulse: 92 80 77 67  Resp: 16 18 20    Temp: 98.4 F (36.9 C) 98.6 F (37 C) 97.8 F (36.6 C) 98 F (36.7 C)  TempSrc: Oral Oral Oral Oral  SpO2: 99% 100% 100% 100%  Weight:      Height: 5\' 6"  (1.676 m)      No intake or output data in the 24 hours ending 04/23/21 1435 Filed Weights   04/22/21 1015  Weight: 77.1 kg    Examination:  General exam: Appears tearful Respiratory system: Clear to auscultation. Respiratory effort normal. Cardiovascular system: S1 & S2 heard, RRR. No JVD, murmurs, rubs, gallops or clicks. No pedal edema. Gastrointestinal system: Abdomen is nondistended, soft and nontender. No organomegaly  or masses felt. Normal bowel sounds heard. Central nervous system: Alert and oriented. No focal neurological deficits. Extremities: Symmetric 5 x 5 power. Skin: No rashes, lesions or ulcers Psychiatry: Judgement and insight appear normal. Mood & affect appropriate.     Data Reviewed: I have personally reviewed following labs and imaging studies  CBC: Recent Labs  Lab 04/21/21 1351 04/22/21 1030 04/23/21 0747  WBC 10.9* 6.4 5.7  NEUTROABS 6.0 3.4  --   HGB 13.5 14.2 12.9   HCT 41.0 43.0 38.9  MCV 90.5 92.3 91.5  PLT 341 287 312   Basic Metabolic Panel: Recent Labs  Lab 04/21/21 1351 04/22/21 1030 04/23/21 0747  NA 143 141 140  K 3.2* 5.8* 3.7  CL 109 105 108  CO2 26 24 27   GLUCOSE 108* 85 103*  BUN 24* 16 17  CREATININE 1.10* 1.02* 0.87  CALCIUM 9.5 9.0 8.4*  MG  --  2.4  --    GFR: Estimated Creatinine Clearance: 74.8 mL/min (by C-G formula based on SCr of 0.87 mg/dL). Liver Function Tests: Recent Labs  Lab 04/21/21 1351 04/22/21 1030 04/23/21 0747  AST 107* 87* 39  ALT 43 36 27  ALKPHOS 88 78 70  BILITOT 0.7 1.2 0.2*  PROT 7.7 7.3 6.3*  ALBUMIN 4.3 3.8 3.5   No results for input(s): LIPASE, AMYLASE in the last 168 hours. Recent Labs  Lab 04/22/21 1113  AMMONIA 34   Coagulation Profile: Recent Labs  Lab 04/21/21 1351  INR 1.0   Cardiac Enzymes: No results for input(s): CKTOTAL, CKMB, CKMBINDEX, TROPONINI in the last 168 hours. BNP (last 3 results) No results for input(s): PROBNP in the last 8760 hours. HbA1C: Recent Labs    04/22/21 1030  HGBA1C 5.4   CBG: No results for input(s): GLUCAP in the last 168 hours. Lipid Profile: Recent Labs    04/22/21 1030  CHOL 194  HDL 48  LDLCALC 125*  TRIG 106  CHOLHDL 4.0   Thyroid Function Tests: Recent Labs    04/22/21 1113  TSH 0.294*   Anemia Panel: Recent Labs    04/22/21 0259  VITAMINB12 285  FOLATE 14.5   Sepsis Labs: No results for input(s): PROCALCITON, LATICACIDVEN in the last 168 hours.  Recent Results (from the past 240 hour(s))  Wet prep, genital     Status: Abnormal   Collection Time: 04/22/21 12:58 AM   Specimen: Urine, Clean Catch  Result Value Ref Range Status   Yeast Wet Prep HPF POC NONE SEEN NONE SEEN Final   Trich, Wet Prep NONE SEEN NONE SEEN Final   Clue Cells Wet Prep HPF POC PRESENT (A) NONE SEEN Final   WBC, Wet Prep HPF POC NONE SEEN NONE SEEN Final   Sperm NONE SEEN  Final    Comment: Performed at [facility], [address] [place], [place]  Resp Panel by RT-PCR (Flu A&B, Covid) Nasopharyngeal Swab     Status: None   Collection Time: 04/22/21  5:28 AM   Specimen: Nasopharyngeal Swab; Nasopharyngeal(NP) swabs in vial transport medium  Result Value Ref Range Status   SARS Coronavirus 2 by RT PCR NEGATIVE NEGATIVE Final    Comment: (NOTE) SARS-CoV-2 target nucleic acids are NOT DETECTED.  The SARS-CoV-2 RNA is generally detectable in upper respiratory specimens during the acute phase of infection. The lowest concentration of SARS-CoV-2 viral copies this assay can detect is 138 copies/mL. A negative result does not preclude SARS-Cov-2 infection and should not be  used as the sole basis for treatment or other patient management decisions. A negative result may occur with  improper specimen collection/handling, submission of specimen other than nasopharyngeal swab, presence of viral mutation(s) within the areas targeted by this assay, and inadequate number of viral copies(<138 copies/mL). A negative result must be combined with clinical observations, patient history, and epidemiological information. The expected result is Negative.  Fact Sheet for Patients:  BloggerCourse.comhttps://www.fda.gov/media/152166/download  Fact Sheet for Healthcare Providers:  SeriousBroker.ithttps://www.fda.gov/media/152162/download  This test is no t yet approved or cleared by the [place] FDA and  has been authorized for detection and/or diagnosis of SARS-CoV-2 by FDA under an Emergency Use Authorization (EUA). This EUA will remain  in effect (meaning this test can be used) for the duration of the COVID-19 declaration under Section 564(b)(1) of the Act, 21 U.S.C.section 360bbb-3(b)(1), unless the authorization is terminated  or revoked sooner.       Influenza A by PCR NEGATIVE NEGATIVE Final   Influenza B by PCR NEGATIVE NEGATIVE Final    Comment: (NOTE) The Xpert Xpress SARS-CoV-2/FLU/RSV plus assay is intended  as an aid in the diagnosis of influenza from Nasopharyngeal swab specimens and should not be used as a sole basis for treatment. Nasal washings and aspirates are unacceptable for Xpert Xpress SARS-CoV-2/FLU/RSV testing.  Fact Sheet for Patients: BloggerCourse.comhttps://www.fda.gov/media/152166/download  Fact Sheet for Healthcare Providers: SeriousBroker.ithttps://www.fda.gov/media/152162/download  This test is not yet approved or cleared by the [place] FDA and has been authorized for detection and/or diagnosis of SARS-CoV-2 by FDA under an Emergency Use Authorization (EUA). This EUA will remain in effect (meaning this test can be used) for the duration of the COVID-19 declaration under Section 564(b)(1) of the Act, 21 U.S.C. section 360bbb-3(b)(1), unless the authorization is terminated or revoked.  Performed at [facility], [address] [place], [place] [address]          Radiology Studies: MR BRAIN WO CONTRAST  Result Date: 04/22/2021 CLINICAL DATA:  Weakness, unsteady gait EXAM: MRI HEAD WITHOUT CONTRAST TECHNIQUE: Multiplanar, multiecho pulse sequences of the brain and surrounding structures were obtained without intravenous contrast. COMPARISON:  CT head dated 1 day prior FINDINGS: Brain: There is no evidence of acute intracranial hemorrhage, extra-axial fluid collection, or infarct. Scattered foci of FLAIR signal abnormality throughout the subcortical and periventricular white matter likely reflects sequela of chronic white matter microangiopathy. There is no suspicious parenchymal signal abnormality. The ventricles are not enlarged. There is no midline shift. There is a 3.9 cm AP by 2.8 cm TV by 2.2 cm cc T2 hypointense lesion centered around the right aspect of the clivus/sella likely reflecting a meningioma. There is encasement of the cavernous ICA and presumed cavernous sinus and sellar invasion. The lesion may extend to the foramen rotundum. The lesion extends to but does not  definitely extend through the foramen ovale. There is mild mass effect on the pons without underlying parenchymal signal abnormality. Vascular: The right ICA flow void is mildly narrowed due to encasement by the above described lesion. The left ICA flow void is patent. Skull and upper cervical spine: Normal marrow signal. Sinuses/Orbits: The paranasal sinuses are clear. The globes and orbits are unremarkable. Other: None. IMPRESSION: 1. No acute intracranial pathology. 2. 3.9 cm solid lesion centered around the right aspect of the clivus/sella likely reflects a meningioma. There is encasement of the cavernous ICA. Recommend post-contrast imaging of the brain for better delineation of vascular involvement. This can be obtained on a nonemergent outpatient basis.  Electronically Signed   By: Lesia Hausen M.D.   On: 04/22/2021 12:34   DG Chest Port 1 View  Result Date: 04/22/2021 CLINICAL DATA:  Cough, chest tightness EXAM: PORTABLE CHEST 1 VIEW COMPARISON:  08/05/2008 FINDINGS: The heart size and mediastinal contours are within normal limits. Both lungs are clear. The visualized skeletal structures are unremarkable. IMPRESSION: No active disease. Electronically Signed   By: Charlett Nose M.D.   On: 04/22/2021 02:23        Scheduled Meds:  aspirin EC  81 mg Oral Daily   enoxaparin (LOVENOX) injection  40 mg Subcutaneous Q24H   gabapentin  100 mg Oral BID   metroNIDAZOLE  500 mg Oral Q12H   Continuous Infusions:  sodium chloride 100 mL/hr at 04/22/21 2138     LOS: 0 days    Time spent: 38 min  Alwyn Ren, MD 04/23/2021, 2:35 PM

## 2021-04-23 NOTE — Evaluation (Addendum)
Physical Therapy Evaluation Patient Details Name: Neveyah Garzon MRN: [id number] DOB: 13-Aug-1966 Today's Date: 04/23/2021        Clinical Impression  Pt is a 55 y.o. female with below HPI. Pt reports she is currently homeless, independent without use of assistive device at baseline, but has been having increasing unsteadiness with gait and reports history of falls. Pt noted to be very unsteady and swaying in standing requiring up to MIN A for stability/maintenance of balance, attempted ~60ft without use of RW, pt unsteady and trialed with use of RW for fall prevention/maximize safety. Pt ambulated ~32ft with MIN A for stability with use of RW. Pt is currently below baseline mobility level requiring increased assist for mobility and demonstrating decreased activity tolerance. Hopeful that pt will progress with mobility during stay and with further assistive device education as needed from therapy services. Pt will benefit from continued skilled PT to increase independence and maximize safety with mobility.         04/23/21 1200  PT Visit Information  Last PT Received On 04/23/21  Assistance Needed +1  History of Present Illness Pt is a 55 year old female who [facility] to [facility] department with multiple complaints including diffuse tingling, unsteady gait- resulting in falls, and vaginal discharge. Past medical history of COPD, polysubstance abuse, gastroesophageal reflux disease, hypertension, nicotine dependence and diastolic congestive heart failure  Precautions  Precautions Fall  Restrictions  Weight Bearing Restrictions No  Home Living  Family/patient expects to be discharged to: Shelter/Homeless (Pt was living in car)  Living Arrangements Alone  Additional Comments Pt states that she was living in [place] with her daughter at one point, but was recently in town visiting her sister and staying at [place] [facility] but lost her room there, had been sleeping in her  vehicle. Pt states that she would not be able to go back to daughter's house until she receives help for polysubstance abuse.  Prior Function  Level of Independence Independent  Communication  Communication No difficulties  Pain Assessment  Pain Assessment Faces  Faces Pain Scale 6  Pain Location B hips  Pain Descriptors / Indicators Discomfort;Aching;Sore  Pain Intervention(s) Limited activity within patient's tolerance;Monitored during session;Repositioned  Cognition  Arousal/Alertness Lethargic  Behavior During Therapy WFL for tasks assessed/performed  Overall Cognitive Status Within Functional Limits for tasks assessed  General Comments Pt lethargic upon entry. Per pt and sitter she has been in/outof sleep all day.  Upper Extremity Assessment  Upper Extremity Assessment Overall WFL for tasks assessed  Lower Extremity Assessment  Lower Extremity Assessment RLE deficits/detail;LLE deficits/detail  RLE Deficits / Details Pt with 3/5 B LE strength with associated grimacing in pain B hips, Lt knee with LE MMT. pt with 4+/5 B DF/PF strength.  RLE Sensation WNL  LLE Sensation  (Pt initially telling therapist dimished sensation to light touch Lmedial ankle and L anterior thigh compared to R, but when retested pt reporting sensation to light touch is same on both sides)  Cervical / Trunk Assessment  Cervical / Trunk Assessment Normal  Bed Mobility  Overal bed mobility Needs Assistance  Bed Mobility Supine to Sit;Sit to Supine  Supine to sit Supervision;HOB elevated  Sit to supine Supervision;HOB elevated  General bed mobility comments supervision for safety with HOB elevated, increased time to complete  Transfers  Overall transfer level Needs assistance  Equipment used None  Transfers Sit to/from Stand  Sit to Stand Min assist  General transfer comment MIN A for stability in  standing. Pt with continuous anterior/posterior sway in standing.  Ambulation/Gait  Ambulation/Gait  assistance Min assist  Gait Distance (Feet) 80 Feet  Assistive device Rolling walker (2 wheeled)  Gait Pattern/deviations Step-through pattern;Decreased stride length;Shuffle;Narrow base of support  General Gait Details trialed ~37ft without use of RW, pt with very small shuffle steps and unsteady. PT trialed pt with use of RW, noted some improved stability cues provided to maintain close proximity to RW. Pt still with significantly decreased gait speed, shuffling steps, and increased knee flexion requiring MIN A from PT for stability to maximize safety. Pt reports increased comfort with use of RW vs. no assistive device.  Gait velocity decr  Balance  Overall balance assessment Needs assistance  Sitting-balance support Feet supported  Sitting balance-Leahy Scale Good  Standing balance support Bilateral upper extremity supported;During functional activity  Standing balance-Leahy Scale Poor  Standing balance comment use of RW  PT - End of Session  Equipment Utilized During Treatment Gait belt  Activity Tolerance Patient tolerated treatment well;Patient limited by fatigue  Patient left in bed;with nursing/sitter in room  Nurse Communication Mobility status  PT Assessment  PT Recommendation/Assessment Patient needs continued PT services  PT Visit Diagnosis Unsteadiness on feet (R26.81);Muscle weakness (generalized) (M62.81);Pain  Pain - Right/Left  (Bilateral)  Pain - part of body Hip;Knee  PT Problem List Decreased strength;Decreased range of motion;Decreased activity tolerance;Decreased balance;Decreased mobility;Decreased knowledge of use of DME;Pain  Barriers to Discharge Inaccessible home environment  Barriers to Discharge Comments Pt currently homeless, living in her car  PT Plan  PT Frequency (ACUTE ONLY) Min 3X/week  PT Treatment/Interventions (ACUTE ONLY) DME instruction;Gait training;Functional mobility training;Therapeutic activities;Therapeutic exercise;Balance  training;Patient/family education  AM-PAC PT "6 Clicks" Mobility Outcome Measure (Version 2)  Help needed turning from your back to your side while in a flat bed without using bedrails? 4  Help needed moving from lying on your back to sitting on the side of a flat bed without using bedrails? 4  Help needed moving to and from a bed to a chair (including a wheelchair)? 3  Help needed standing up from a chair using your arms (e.g., wheelchair or bedside chair)? 3  Help needed to walk in hospital room? 3  Help needed climbing 3-5 steps with a railing?  2  6 Click Score 19  Consider Recommendation of Discharge To: Home with [facility]  Progressive Mobility  What is the highest level of mobility based on the progressive mobility assessment? Level 4 (Walks with assist in room) - Balance while marching in place and cannot step forward and back - Complete  Mobility Ambulated with assistance in hallway  PT Recommendation  Follow Up Recommendations SNF (May be appropriate for inpatient psychiatric admission- per psych consult)  PT equipment Rolling walker with 5" wheels  Individuals Consulted  Consulted and Agree with Results and Recommendations Patient  Acute Rehab PT Goals  Patient Stated Goal Go somewhere to get help with subastance abuse and get stronger to reduce falls  PT Goal Formulation With patient  Time For Goal Achievement 05/07/21  Potential to Achieve Goals Good  PT Time Calculation  PT Start Time (ACUTE ONLY) 0932  PT Stop Time (ACUTE ONLY) 0950  PT Time Calculation (min) (ACUTE ONLY) 18 min  PT General Charges  $$ ACUTE PT VISIT 1 Visit  Written Expression  Dominant Hand Right        Lyman Speller PT, DPT  Acute Rehabilitation Services  Office [phone]  04/23/2021, 4:28 PM

## 2021-04-24 MED ORDER — CLONIDINE HCL 0.1 MG PO TABS
0.1000 mg | ORAL_TABLET | Freq: Three times a day (TID) | ORAL | Status: DC
  Administered 2021-04-24 – 2021-04-26 (×7): 0.1 mg via ORAL
  Filled 2021-04-24 (×7): qty 1

## 2021-04-24 NOTE — Plan of Care (Signed)
  Problem: Education: Goal: Ability to make informed decisions regarding treatment will improve Outcome: Progressing   Problem: Medication: Goal: Compliance with prescribed medication regimen will improve Outcome: Progressing   Problem: Activity: Goal: Risk for activity intolerance will decrease Outcome: Progressing

## 2021-04-24 NOTE — Progress Notes (Signed)
PROGRESS NOTE    Carrie Adkins  ULA:[id number] DOB: [birth year] DOA: 04/21/2021 PCP: System, Provider Not In   Brief Narrative: 55 year old female with history of polysubstance abuse, diastolic heart failure, hypertension, COPD, GERD, admitted with multiple complaints generalized weakness diffuse tingling unsteady gait and vaginal discharge.  She lives in a car and uses cocaine on a regular basis.  She has 4 adult children, none that lives with her.  She has 2 sisters that are involved in her care.  She reports that if she was not here she would have killed herself. She also reports having unprotected sex with new vaginal discharge positive for clue cells, bacterial vaginosis.  She is admitted for active suicidal ideations and plans and for psych consult.  Assessment & Plan:   Principal Problem:   Unsteady gait Active Problems:   COPD without exacerbation (HCC)   GERD without esophagitis   Nicotine dependence, cigarettes, uncomplicated   Suicidal ideation   Polysubstance abuse (HCC)   Coronary artery disease involving native coronary artery of native heart   Bacterial vaginosis   Nausea & vomiting  #1 suicidal ideation appreciate psych input.  Started on gabapentin 100 mg bid Continue one-to-one sitter Psych recommends inpatient psych admission when cleared medically.  #2 unsteady gait with frequent falls MRI of the brain does not show any acute findings. PT eval ordered. Further work-up includes TSH 0.294 A1c 5.4 B12 285 folate 14.5 RPR nonreactive COVID-negative HIV nonreactive  #3 history of COPD without exacerbation stable #4 polysubstance abuse with urine drug screen positive for cocaine amphetamines and THC  #5 CAD with history of diastolic heart failure stable, DC IV fluids  #7 bacterial vaginosis on 7-day course of metronidazole bid Wet prep clue cells  #8 ELEVATED LDL-Start lipitor   #9 elevated AST resolved  Estimated body mass index is 27.44 kg/m as  calculated from the following:   Height as of this encounter: 5\' 6"  (1.676 m).   Weight as of this encounter: 77.1 kg.  DVT prophylaxis: lovenox Code Status: full Family Communication: dw sisters  Disposition Plan:  Status is: inpatient  The patient will require care spanning > 2 midnights and should be moved to inpatient because: Altered mental status  Dispo: The patient is from: Home              Anticipated d/c is to:  [facility]              Patient currently is not medically stable to d/c.   Difficult to place patient No   Consultants:  PSYCH  Procedures: NONE Antimicrobials:FLAGYL  Subjective: She is more awake and alert today ambulated with physical therapy yesterday.  Objective: Vitals:   04/23/21 1900 04/23/21 1953 04/24/21 0604 04/24/21 1315  BP: (!) 151/101 (!) 163/92 (!) 160/102 (!) 145/72  Pulse: 75 65 77 77  Resp:   20 18  Temp: 98.8 F (37.1 C)  98.2 F (36.8 C) (!) 97.3 F (36.3 C)  TempSrc: Oral  Oral Oral  SpO2: 100% 100% 100% 99%  Weight:      Height:        Intake/Output Summary (Last 24 hours) at 04/24/2021 1527 Last data filed at 04/24/2021 0041 Gross per 24 hour  Intake 680 ml  Output --  Net 680 ml   Filed Weights   04/22/21 1015  Weight: 77.1 kg    Examination:  General exam: Appears tearful Respiratory system: Clear to auscultation. Respiratory effort normal. Cardiovascular system: S1 & S2 heard,  RRR. No JVD, murmurs, rubs, gallops or clicks. No pedal edema. Gastrointestinal system: Abdomen is nondistended, soft and nontender. No organomegaly or masses felt. Normal bowel sounds heard. Central nervous system: Alert and oriented. No focal neurological deficits. Extremities: Symmetric 5 x 5 power. Skin: No rashes, lesions or ulcers Psychiatry: Judgement and insight appear normal. Mood & affect appropriate.     Data Reviewed: I have personally reviewed following labs and imaging studies  CBC: Recent Labs  Lab 04/21/21 1351  04/22/21 1030 04/23/21 0747  WBC 10.9* 6.4 5.7  NEUTROABS 6.0 3.4  --   HGB 13.5 14.2 12.9  HCT 41.0 43.0 38.9  MCV 90.5 92.3 91.5  PLT 341 287 312    Basic Metabolic Panel: Recent Labs  Lab 04/21/21 1351 04/22/21 1030 04/23/21 0747  NA 143 141 140  K 3.2* 5.8* 3.7  CL 109 105 108  CO2 26 24 27   GLUCOSE 108* 85 103*  BUN 24* 16 17  CREATININE 1.10* 1.02* 0.87  CALCIUM 9.5 9.0 8.4*  MG  --  2.4  --     GFR: Estimated Creatinine Clearance: 74.8 mL/min (by C-G formula based on SCr of 0.87 mg/dL). Liver Function Tests: Recent Labs  Lab 04/21/21 1351 04/22/21 1030 04/23/21 0747  AST 107* 87* 39  ALT 43 36 27  ALKPHOS 88 78 70  BILITOT 0.7 1.2 0.2*  PROT 7.7 7.3 6.3*  ALBUMIN 4.3 3.8 3.5    No results for input(s): LIPASE, AMYLASE in the last 168 hours. Recent Labs  Lab 04/22/21 1113  AMMONIA 34    Coagulation Profile: Recent Labs  Lab 04/21/21 1351  INR 1.0    Cardiac Enzymes: No results for input(s): CKTOTAL, CKMB, CKMBINDEX, TROPONINI in the last 168 hours. BNP (last 3 results) No results for input(s): PROBNP in the last 8760 hours. HbA1C: Recent Labs    04/22/21 1030  HGBA1C 5.4    CBG: No results for input(s): GLUCAP in the last 168 hours. Lipid Profile: Recent Labs    04/22/21 1030  CHOL 194  HDL 48  LDLCALC 125*  TRIG 106  CHOLHDL 4.0    Thyroid Function Tests: Recent Labs    04/22/21 1113  TSH 0.294*    Anemia Panel: Recent Labs    04/22/21 0259  VITAMINB12 285  FOLATE 14.5    Sepsis Labs: No results for input(s): PROCALCITON, LATICACIDVEN in the last 168 hours.  Recent Results (from the past 240 hour(s))  Wet prep, genital     Status: Abnormal   Collection Time: 04/22/21 12:58 AM   Specimen: Urine, Clean Catch  Result Value Ref Range Status   Yeast Wet Prep HPF POC NONE SEEN NONE SEEN Final   Trich, Wet Prep NONE SEEN NONE SEEN Final   Clue Cells Wet Prep HPF POC PRESENT (A) NONE SEEN Final   WBC, Wet Prep  HPF POC NONE SEEN NONE SEEN Final   Sperm NONE SEEN  Final    Comment: Performed at [facility], [address], [place], [place]  Resp Panel by RT-PCR (Flu A&B, Covid) Nasopharyngeal Swab     Status: None   Collection Time: 04/22/21  5:28 AM   Specimen: Nasopharyngeal Swab; Nasopharyngeal(NP) swabs in vial transport medium  Result Value Ref Range Status   SARS Coronavirus 2 by RT PCR NEGATIVE NEGATIVE Final    Comment: (NOTE) SARS-CoV-2 target nucleic acids are NOT DETECTED.  The SARS-CoV-2 RNA is generally detectable in upper respiratory specimens during the  acute phase of infection. The lowest concentration of SARS-CoV-2 viral copies this assay can detect is 138 copies/mL. A negative result does not preclude SARS-Cov-2 infection and should not be used as the sole basis for treatment or other patient management decisions. A negative result may occur with  improper specimen collection/handling, submission of specimen other than nasopharyngeal swab, presence of viral mutation(s) within the areas targeted by this assay, and inadequate number of viral copies(<138 copies/mL). A negative result must be combined with clinical observations, patient history, and epidemiological information. The expected result is Negative.  Fact Sheet for Patients:  BloggerCourse.com  Fact Sheet for Healthcare Providers:  SeriousBroker.it  This test is no t yet approved or cleared by the [place] FDA and  has been authorized for detection and/or diagnosis of SARS-CoV-2 by FDA under an Emergency Use Authorization (EUA). This EUA will remain  in effect (meaning this test can be used) for the duration of the COVID-19 declaration under Section 564(b)(1) of the Act, 21 U.S.C.section 360bbb-3(b)(1), unless the authorization is terminated  or revoked sooner.       Influenza A by PCR NEGATIVE NEGATIVE Final   Influenza B by  PCR NEGATIVE NEGATIVE Final    Comment: (NOTE) The Xpert Xpress SARS-CoV-2/FLU/RSV plus assay is intended as an aid in the diagnosis of influenza from Nasopharyngeal swab specimens and should not be used as a sole basis for treatment. Nasal washings and aspirates are unacceptable for Xpert Xpress SARS-CoV-2/FLU/RSV testing.  Fact Sheet for Patients: BloggerCourse.com  Fact Sheet for Healthcare Providers: SeriousBroker.it  This test is not yet approved or cleared by the [place] FDA and has been authorized for detection and/or diagnosis of SARS-CoV-2 by FDA under an Emergency Use Authorization (EUA). This EUA will remain in effect (meaning this test can be used) for the duration of the COVID-19 declaration under Section 564(b)(1) of the Act, 21 U.S.C. section 360bbb-3(b)(1), unless the authorization is terminated or revoked.  Performed at [facility], [address], [place], [place] [address]           Radiology Studies: No results found.      Scheduled Meds:  aspirin EC  81 mg Oral Daily   atorvastatin  10 mg Oral Daily   cloNIDine  0.1 mg Oral TID   enoxaparin (LOVENOX) injection  40 mg Subcutaneous Q24H   gabapentin  100 mg Oral BID   metroNIDAZOLE  500 mg Oral Q12H   Continuous Infusions:     LOS: 1 day    Time spent: 38 min  Alwyn Ren, MD 04/24/2021, 3:27 PM

## 2021-04-24 NOTE — Plan of Care (Signed)
  Problem: Medication: Goal: Compliance with prescribed medication regimen will improve Outcome: Progressing   

## 2021-04-24 NOTE — Progress Notes (Signed)
Report received from ongoing nurse. Agreed with nurse assessment of patient and will cont to monitor. 

## 2021-04-24 NOTE — Consult Note (Signed)
[facility] Face-to-Face Psychiatry Consult   Reason for Consult:  SUicidal ideation Referring Physician:  Dr. Molli Hazard Patient Identification: Carrie Adkins MRN:  [id number] Principal Diagnosis: Unsteady gait Diagnosis:  Principal Problem:   Unsteady gait Active Problems:   COPD without exacerbation (HCC)   GERD without esophagitis   Nicotine dependence, cigarettes, uncomplicated   Suicidal ideation   Polysubstance abuse (HCC)   Coronary artery disease involving native coronary artery of native heart   Bacterial vaginosis   Nausea & vomiting   Total Time spent with patient: 15 minutes  Subjective:   Carrie Adkins is a 55 y.o. female patient admitted with vaginal discharge and generalized weakness; psych consulted for SI. Seen yeterday  by psychiaty-started gabapentin.  UDS+THC, cocaine, amphetamines.   Patient seen and chart reviewed. Pt continues to report SI with no plan and unable to contract for safety- "If I were to be released from here, I have no reason to live". She reports ongoing low mood. She discusses her substance use and that she relapsed about 1 month ago on crack. She denies HI. She denies AVH currently but states that she saw her sister and mother who are both deceased prior to her hospital admission; unclear timeline but likely in the context of intoxication. She expressed that she would be open to substance use treatment in the future and expresses interest in residential rehab.     HPI:  This is a 55 year old female with history of polysubstance abuse COPD nicotine dependence diastolic heart failure hypertension admitted with multiple complaints including numbness confusion with general discharge generalized weakness and suicidal ideations.  Review of her chart indicates her urine drug screen is positive for cocaine amphetamine and THC.  MRI of the brain shows no acute findings though meningioma may be noted.  This will need outpatient follow-up with contrast  MRI.  I have also consulted psych for suicidal ideations.  Past Psychiatric History: Depression and anxiety  Risk to Self: Yes Risk to Others: Denies Prior Inpatient Therapy: Denies Prior Outpatient Therapy: Denies  Past Medical History:  Past Medical History:  Diagnosis Date   COPD without exacerbation (HCC) 03/01/2007   Qualifier: Diagnosis of  By: Beverely Pace MD, Curtis     Coronary artery disease involving native coronary artery of native heart 04/22/2021   Nicotine dependence, cigarettes, uncomplicated 04/22/2021   Polysubstance abuse (HCC) 04/22/2021   History reviewed. No pertinent surgical history. Family History:  Family History  Problem Relation Age of Onset   Heart disease Neg Hx    Family Psychiatric  History: Denies by both patient and her sister. Social History:  Social History   Substance and Sexual Activity  Alcohol Use Not Currently     Social History   Substance and Sexual Activity  Drug Use Yes   Types: Cocaine, Methamphetamines, Marijuana    Social History   Socioeconomic History   Marital status: Legally Separated    Spouse name: Not on file   Number of children: Not on file   Years of education: Not on file   Highest education level: Not on file  Occupational History   Not on file  Tobacco Use   Smoking status: Every Day    Types: Cigarettes   Smokeless tobacco: Never  Vaping Use   Vaping Use: Never used  Substance and Sexual Activity   Alcohol use: Not Currently   Drug use: Yes    Types: Cocaine, Methamphetamines, Marijuana   Sexual activity: Yes    Birth control/protection: None  Other Topics Concern   Not on file  Social History Narrative   Not on file   Social Determinants of Health   Financial Resource Strain: Not on file  Food Insecurity: Not on file  Transportation Needs: Not on file  Physical Activity: Not on file  Stress: Not on file  Social Connections: Not on file   Additional Social History:    Allergies:  No Known  Allergies  Labs:  Results for orders placed or performed during the hospital encounter of 04/21/21 (from the past 48 hour(s))  TSH     Status: Abnormal   Collection Time: 04/22/21 11:13 AM  Result Value Ref Range   TSH 0.294 (L) 0.350 - 4.500 uIU/mL    Comment: Performed by a 3rd Generation assay with a functional sensitivity of <=0.01 uIU/mL. Performed at [facility], [address] [place], [place] [address]   Ammonia     Status: None   Collection Time: 04/22/21 11:13 AM  Result Value Ref Range   Ammonia 34 9 - 35 umol/L    Comment: Performed at [facility], [address], [place], [place] [address]  CBC     Status: None   Collection Time: 04/23/21  7:47 AM  Result Value Ref Range   WBC 5.7 4.0 - 10.5 K/uL   RBC 4.25 3.87 - 5.11 MIL/uL   Hemoglobin 12.9 12.0 - 15.0 g/dL   HCT 78.5 88.5 - 02.7 %   MCV 91.5 80.0 - 100.0 fL   MCH 30.4 26.0 - 34.0 pg   MCHC 33.2 30.0 - 36.0 g/dL   RDW 74.1 28.7 - 86.7 %   Platelets 312 150 - 400 K/uL   nRBC 0.0 0.0 - 0.2 %    Comment: Performed at [facility], [address], [place], [place] [address]  Comprehensive metabolic panel     Status: Abnormal   Collection Time: 04/23/21  7:47 AM  Result Value Ref Range   Sodium 140 135 - 145 mmol/L   Potassium 3.7 3.5 - 5.1 mmol/L    Comment: DELTA CHECK NOTED   Chloride 108 98 - 111 mmol/L   CO2 27 22 - 32 mmol/L   Glucose, Bld 103 (H) 70 - 99 mg/dL    Comment: Glucose reference range applies only to samples taken after fasting for at least 8 hours.   BUN 17 6 - 20 mg/dL   Creatinine, Ser 4.70 0.44 - 1.00 mg/dL   Calcium 8.4 (L) 8.9 - 10.3 mg/dL   Total Protein 6.3 (L) 6.5 - 8.1 g/dL   Albumin 3.5 3.5 - 5.0 g/dL   AST 39 15 - 41 U/L   ALT 27 0 - 44 U/L   Alkaline Phosphatase 70 38 - 126 U/L   Total Bilirubin 0.2 (L) 0.3 - 1.2 mg/dL   GFR, Estimated >96 >28 mL/min    Comment: (NOTE) Calculated using the CKD-EPI Creatinine  Equation (2021)    Anion gap 5 5 - 15    Comment: Performed at [facility], [address], [place], [place] [address]    Current Facility-Administered Medications  Medication Dose Route Frequency Provider Last Rate Last Admin   acetaminophen (TYLENOL) tablet 650 mg  650 mg Oral Q6H PRN Marinda Elk, MD   650 mg at 04/23/21 2134   Or   acetaminophen (TYLENOL) suppository 650 mg  650 mg Rectal Q6H PRN Shalhoub, Deno Lunger, MD       aspirin  EC tablet 81 mg  81 mg Oral Daily Shalhoub, Deno LungerGeorge J, MD   81 mg at 04/24/21 0930   atorvastatin (LIPITOR) tablet 10 mg  10 mg Oral Daily Alwyn RenMathews, Tomeka G, MD   10 mg at 04/24/21 0930   cloNIDine (CATAPRES) tablet 0.1 mg  0.1 mg Oral TID Alwyn RenMathews, Allien G, MD   0.1 mg at 04/24/21 0931   enoxaparin (LOVENOX) injection 40 mg  40 mg Subcutaneous Q24H Marinda ElkShalhoub, George J, MD   40 mg at 04/24/21 0930   gabapentin (NEURONTIN) capsule 100 mg  100 mg Oral BID Maryagnes AmosStarkes-Perry, Takia S, FNP   100 mg at 04/24/21 0930   metroNIDAZOLE (FLAGYL) tablet 500 mg  500 mg Oral Q12H Shalhoub, Deno LungerGeorge J, MD   500 mg at 04/24/21 0930   ondansetron (ZOFRAN) tablet 4 mg  4 mg Oral Q6H PRN Shalhoub, Deno LungerGeorge J, MD       Or   ondansetron (ZOFRAN) injection 4 mg  4 mg Intravenous Q6H PRN Shalhoub, Deno LungerGeorge J, MD       polyethylene glycol (MIRALAX / GLYCOLAX) packet 17 g  17 g Oral Daily PRN Shalhoub, Deno LungerGeorge J, MD        Musculoskeletal: Strength & Muscle Tone: within normal limits Gait & Station: normal Patient leans: N/A      Psychiatric Specialty Exam:  Presentation  General Appearance: Appropriate for Environment; Casual  Eye Contact:Fair  Speech:Clear and Coherent; Normal Rate  Speech Volume:Decreased  Handedness:Right   Mood and Affect  Mood:Depressed  Affect:Appropriate; Congruent   Thought Process  Thought Processes:Coherent; Goal Directed; Linear  Descriptions of Associations:Intact  Orientation:Full (Time, Place and  Person)  Thought Content:WDL  History of Schizophrenia/Schizoaffective disorder:No data recorded Duration of Psychotic Symptoms:No data recorded Hallucinations:Hallucinations: None  Ideas of Reference:None  Suicidal Thoughts:Suicidal Thoughts: No  Homicidal Thoughts:Homicidal Thoughts: No   Sensorium  Memory:Immediate Good; Recent Good; Remote Good  Judgment:Fair  Insight:Fair   Executive Functions  Concentration:Good  Attention Span:Good  Recall:Good  Fund of Knowledge:Good  Language:Good   Psychomotor Activity  Psychomotor Activity:Psychomotor Activity: Normal   Assets  Assets:Communication Skills; Desire for Improvement; Physical Health; Resilience   Sleep  Sleep:Sleep: Fair   Physical Exam: Physical Exam Constitutional:      Appearance: Normal appearance. She is normal weight.  HENT:     Head: Normocephalic and atraumatic.  Pulmonary:     Effort: Pulmonary effort is normal.  Neurological:     Mental Status: She is alert.   Review of Systems  Psychiatric/Behavioral:  Positive for depression, substance abuse and suicidal ideas. Negative for hallucinations.   Blood pressure (!) 160/102, pulse 77, temperature 98.2 F (36.8 C), temperature source Oral, resp. rate 20, height 5\' 6"  (1.676 m), weight 77.1 kg, SpO2 100 %. Body mass index is 27.44 kg/m.  Treatment Plan Summary: Daily contact with patient to assess and evaluate symptoms and progress in treatment, Medication management, and Plan will recommend inpatient psychiatric admission once medically stable as she is unable to contract for safety   -Continue to monitor patient closely for substance use withdrawal symptoms.  Please expect agitation, likely verbal aggression, and mood lability as patient begins to withdraw. -Continue one-to-one safety sitter at this time as patient is unable to contract for safety. -Continue gabapentin 100 mg p.o. twice daily  Disposition: Recommend psychiatric  Inpatient admission when medically cleared.-consult TOC to facilitate placement  -psychiatry will follow up on Monday if she remains in the hospital -recommendations communicated to primary team via epic  secure chat  Estella Husk, MD Attending psychiatrist 04/24/2021 11:08 AM

## 2021-04-25 LAB — CBC
HCT: 37.7 % (ref 36.0–46.0)
Hemoglobin: 12.4 g/dL (ref 12.0–15.0)
MCH: 30.3 pg (ref 26.0–34.0)
MCHC: 32.9 g/dL (ref 30.0–36.0)
MCV: 92.2 fL (ref 80.0–100.0)
Platelets: 325 10*3/uL (ref 150–400)
RBC: 4.09 MIL/uL (ref 3.87–5.11)
RDW: 14 % (ref 11.5–15.5)
WBC: 6 10*3/uL (ref 4.0–10.5)
nRBC: 0 % (ref 0.0–0.2)

## 2021-04-25 LAB — COMPREHENSIVE METABOLIC PANEL
ALT: 21 U/L (ref 0–44)
AST: 24 U/L (ref 15–41)
Albumin: 3.4 g/dL — ABNORMAL LOW (ref 3.5–5.0)
Alkaline Phosphatase: 65 U/L (ref 38–126)
Anion gap: 6 (ref 5–15)
BUN: 15 mg/dL (ref 6–20)
CO2: 27 mmol/L (ref 22–32)
Calcium: 9.2 mg/dL (ref 8.9–10.3)
Chloride: 112 mmol/L — ABNORMAL HIGH (ref 98–111)
Creatinine, Ser: 0.88 mg/dL (ref 0.44–1.00)
GFR, Estimated: >60 mL/min (ref >60)
Glucose, Bld: 102 mg/dL — ABNORMAL HIGH (ref 70–99)
Potassium: 3.7 mmol/L (ref 3.5–5.1)
Sodium: 145 mmol/L (ref 135–145)
Total Bilirubin: 0.3 mg/dL (ref 0.3–1.2)
Total Protein: 6.2 g/dL — ABNORMAL LOW (ref 6.5–8.1)

## 2021-04-25 MED ORDER — GUAIFENESIN-DM 100-10 MG/5ML PO SYRP
5.0000 mL | ORAL_SOLUTION | ORAL | Status: DC | PRN
  Administered 2021-04-25: 5 mL via ORAL
  Filled 2021-04-25: qty 10

## 2021-04-25 MED ORDER — ALBUTEROL SULFATE (2.5 MG/3ML) 0.083% IN NEBU: 3.0000 mL | INHALATION_SOLUTION | Freq: Four times a day (QID) | RESPIRATORY_TRACT | Status: DC | PRN

## 2021-04-25 NOTE — Plan of Care (Signed)
  Problem: Education: Goal: Ability to make informed decisions regarding treatment will improve Outcome: Progressing   Problem: Medication: Goal: Compliance with prescribed medication regimen will improve Outcome: Progressing   Problem: Self-Concept: Goal: Ability to disclose and discuss suicidal ideas will improve Outcome: Progressing Goal: Will verbalize positive feelings about self Outcome: Progressing

## 2021-04-25 NOTE — Plan of Care (Signed)
  Problem: Medication: Goal: Compliance with prescribed medication regimen will improve Outcome: Progressing   

## 2021-04-25 NOTE — Progress Notes (Signed)
PROGRESS NOTE    Carrie Adkins  TFT:[id number] DOB: 12-15-1965 DOA: 04/21/2021 PCP: System, Provider Not In   Brief Narrative: 55 year old female with history of polysubstance abuse, diastolic heart failure, hypertension, COPD, GERD, admitted with multiple complaints generalized weakness diffuse tingling unsteady gait and vaginal discharge.  She lives in a car and uses cocaine on a regular basis.  She has 4 adult children, none that lives with her.  She has 2 sisters that are involved in her care.  She reports that if she was not here she would have killed herself. She also reports having unprotected sex with new vaginal discharge positive for clue cells, bacterial vaginosis.  She is admitted for active suicidal ideations and plans and for psych consult.  Assessment & Plan:   Principal Problem:   Unsteady gait Active Problems:   COPD without exacerbation (HCC)   GERD without esophagitis   Nicotine dependence, cigarettes, uncomplicated   Suicidal ideation   Polysubstance abuse (HCC)   Coronary artery disease involving native coronary artery of native heart   Bacterial vaginosis   Nausea & vomiting  #1 suicidal ideation appreciate psych input.  Started on gabapentin 100 mg bid Continue one-to-one sitter Psych recommends inpatient psych admission when cleared medically.  #2 unsteady gait with frequent falls MRI of the brain does not show any acute findings. PT eval ordered. Further work-up includes TSH 0.294 A1c 5.4 B12 285 folate 14.5 RPR nonreactive COVID-negative HIV nonreactive  #3 history of COPD without exacerbation stable #4 polysubstance abuse with urine drug screen positive for cocaine amphetamines and THC  #5 CAD with history of diastolic heart failure stable, DC IV fluids  #7 bacterial vaginosis on 7-day course of metronidazole bid Wet prep clue cells  #8 ELEVATED LDL-Start lipitor   #9 elevated AST resolved  #10 hypertension started on clonidine blood  pressure stabilized  Estimated body mass index is 27.44 kg/m as calculated from the following:   Height as of this encounter: 5\' 6"  (1.676 m).   Weight as of this encounter: 77.1 kg.  DVT prophylaxis: lovenox Code Status: full Family Communication: dw sisters  Disposition Plan:  Status is: inpatient  The patient will require care spanning > 2 midnights and should be moved to inpatient because: Altered mental status  Dispo: The patient is from: Home              Anticipated d/c is to:  [facility]              Patient currently is not medically stable to d/c.   Difficult to place patient No   Consultants:  PSYCH  Procedures: NONE Antimicrobials:FLAGYL  Subjective: She is resting in bed sitter by the bedside  No events overnight no new complaints Objective: Vitals:   04/24/21 0604 04/24/21 1315 04/24/21 2200 04/25/21 0541  BP: (!) 160/102 (!) 145/72 136/73 123/86  Pulse: 77 77 80 64  Resp: 20 18 18    Temp: 98.2 F (36.8 C) (!) 97.3 F (36.3 C) 98.2 F (36.8 C) 98.4 F (36.9 C)  TempSrc: Oral Oral Oral   SpO2: 100% 99% 100% 100%  Weight:      Height:        Intake/Output Summary (Last 24 hours) at 04/25/2021 1229 Last data filed at 04/25/2021 1155 Gross per 24 hour  Intake 600 ml  Output --  Net 600 ml    Filed Weights   04/22/21 1015  Weight: 77.1 kg    Examination:  General exam: Appears tearful  Respiratory system: Clear to auscultation. Respiratory effort normal. Cardiovascular system: S1 & S2 heard, RRR. No JVD, murmurs, rubs, gallops or clicks. No pedal edema. Gastrointestinal system: Abdomen is nondistended, soft and nontender. No organomegaly or masses felt. Normal bowel sounds heard. Central nervous system: Alert and oriented. No focal neurological deficits. Extremities: Symmetric 5 x 5 power. Skin: No rashes, lesions or ulcers Psychiatry: Judgement and insight appear normal. Mood & affect appropriate.     Data Reviewed: I have personally reviewed  following labs and imaging studies  CBC: Recent Labs  Lab 04/21/21 1351 04/22/21 1030 04/23/21 0747  WBC 10.9* 6.4 5.7  NEUTROABS 6.0 3.4  --   HGB 13.5 14.2 12.9  HCT 41.0 43.0 38.9  MCV 90.5 92.3 91.5  PLT 341 287 312    Basic Metabolic Panel: Recent Labs  Lab 04/21/21 1351 04/22/21 1030 04/23/21 0747  NA 143 141 140  K 3.2* 5.8* 3.7  CL 109 105 108  CO2 26 24 27   GLUCOSE 108* 85 103*  BUN 24* 16 17  CREATININE 1.10* 1.02* 0.87  CALCIUM 9.5 9.0 8.4*  MG  --  2.4  --     GFR: Estimated Creatinine Clearance: 74.8 mL/min (by C-G formula based on SCr of 0.87 mg/dL). Liver Function Tests: Recent Labs  Lab 04/21/21 1351 04/22/21 1030 04/23/21 0747  AST 107* 87* 39  ALT 43 36 27  ALKPHOS 88 78 70  BILITOT 0.7 1.2 0.2*  PROT 7.7 7.3 6.3*  ALBUMIN 4.3 3.8 3.5    No results for input(s): LIPASE, AMYLASE in the last 168 hours. Recent Labs  Lab 04/22/21 1113  AMMONIA 34    Coagulation Profile: Recent Labs  Lab 04/21/21 1351  INR 1.0    Cardiac Enzymes: No results for input(s): CKTOTAL, CKMB, CKMBINDEX, TROPONINI in the last 168 hours. BNP (last 3 results) No results for input(s): PROBNP in the last 8760 hours. HbA1C: No results for input(s): HGBA1C in the last 72 hours.  CBG: No results for input(s): GLUCAP in the last 168 hours. Lipid Profile: No results for input(s): CHOL, HDL, LDLCALC, TRIG, CHOLHDL, LDLDIRECT in the last 72 hours.  Thyroid Function Tests: No results for input(s): TSH, T4TOTAL, FREET4, T3FREE, THYROIDAB in the last 72 hours.  Anemia Panel: No results for input(s): VITAMINB12, FOLATE, FERRITIN, TIBC, IRON, RETICCTPCT in the last 72 hours.  Sepsis Labs: No results for input(s): PROCALCITON, LATICACIDVEN in the last 168 hours.  Recent Results (from the past 240 hour(s))  Wet prep, genital     Status: Abnormal   Collection Time: 04/22/21 12:58 AM   Specimen: Urine, Clean Catch  Result Value Ref Range Status   Yeast Wet  Prep HPF POC NONE SEEN NONE SEEN Final   Trich, Wet Prep NONE SEEN NONE SEEN Final   Clue Cells Wet Prep HPF POC PRESENT (A) NONE SEEN Final   WBC, Wet Prep HPF POC NONE SEEN NONE SEEN Final   Sperm NONE SEEN  Final    Comment: Performed at [facility], [address] [place], [place]  Resp Panel by RT-PCR (Flu A&B, Covid) Nasopharyngeal Swab     Status: None   Collection Time: 04/22/21  5:28 AM   Specimen: Nasopharyngeal Swab; Nasopharyngeal(NP) swabs in vial transport medium  Result Value Ref Range Status   SARS Coronavirus 2 by RT PCR NEGATIVE NEGATIVE Final    Comment: (NOTE) SARS-CoV-2 target nucleic acids are NOT DETECTED.  The SARS-CoV-2 RNA is generally detectable in  upper respiratory specimens during the acute phase of infection. The lowest concentration of SARS-CoV-2 viral copies this assay can detect is 138 copies/mL. A negative result does not preclude SARS-Cov-2 infection and should not be used as the sole basis for treatment or other patient management decisions. A negative result may occur with  improper specimen collection/handling, submission of specimen other than nasopharyngeal swab, presence of viral mutation(s) within the areas targeted by this assay, and inadequate number of viral copies(<138 copies/mL). A negative result must be combined with clinical observations, patient history, and epidemiological information. The expected result is Negative.  Fact Sheet for Patients:  BloggerCourse.com  Fact Sheet for Healthcare Providers:  SeriousBroker.it  This test is no t yet approved or cleared by the [place] FDA and  has been authorized for detection and/or diagnosis of SARS-CoV-2 by FDA under an Emergency Use Authorization (EUA). This EUA will remain  in effect (meaning this test can be used) for the duration of the COVID-19 declaration under Section 564(b)(1) of the Act,  21 U.S.C.section 360bbb-3(b)(1), unless the authorization is terminated  or revoked sooner.       Influenza A by PCR NEGATIVE NEGATIVE Final   Influenza B by PCR NEGATIVE NEGATIVE Final    Comment: (NOTE) The Xpert Xpress SARS-CoV-2/FLU/RSV plus assay is intended as an aid in the diagnosis of influenza from Nasopharyngeal swab specimens and should not be used as a sole basis for treatment. Nasal washings and aspirates are unacceptable for Xpert Xpress SARS-CoV-2/FLU/RSV testing.  Fact Sheet for Patients: BloggerCourse.com  Fact Sheet for Healthcare Providers: SeriousBroker.it  This test is not yet approved or cleared by the [place] FDA and has been authorized for detection and/or diagnosis of SARS-CoV-2 by FDA under an Emergency Use Authorization (EUA). This EUA will remain in effect (meaning this test can be used) for the duration of the COVID-19 declaration under Section 564(b)(1) of the Act, 21 U.S.C. section 360bbb-3(b)(1), unless the authorization is terminated or revoked.  Performed at [facility], [address], [place], [place] [address]           Radiology Studies: No results found.      Scheduled Meds:  aspirin EC  81 mg Oral Daily   atorvastatin  10 mg Oral Daily   cloNIDine  0.1 mg Oral TID   enoxaparin (LOVENOX) injection  40 mg Subcutaneous Q24H   gabapentin  100 mg Oral BID   metroNIDAZOLE  500 mg Oral Q12H   Continuous Infusions:     LOS: 2 days    Time spent: 38 min  Alwyn Ren, MD 04/25/2021, 12:29 PM

## 2021-04-26 ENCOUNTER — Inpatient Hospital Stay (HOSPITAL_COMMUNITY): Payer: Medicaid Other

## 2021-04-26 DIAGNOSIS — R4182 Altered mental status, unspecified: Secondary | ICD-10-CM

## 2021-04-26 DIAGNOSIS — R2681 Unsteadiness on feet: Secondary | ICD-10-CM | POA: Diagnosis not present

## 2021-04-26 DIAGNOSIS — R9431 Abnormal electrocardiogram [ECG] [EKG]: Secondary | ICD-10-CM

## 2021-04-26 LAB — TROPONIN I (HIGH SENSITIVITY)
Troponin I (High Sensitivity): 5 ng/L (ref <18)
Troponin I (High Sensitivity): 4 ng/L (ref <18)

## 2021-04-26 LAB — ECHOCARDIOGRAM COMPLETE
Area-P 1/2: 5.06 cm2
Height: 66 in
S' Lateral: 2.8 cm
Weight: 2720 oz

## 2021-04-26 LAB — VITAMIN B1: Vitamin B1 (Thiamine): 98.3 nmol/L (ref 66.5–200.0)

## 2021-04-26 MED ORDER — LISINOPRIL 10 MG PO TABS
10.0000 mg | ORAL_TABLET | Freq: Every day | ORAL | Status: DC
  Administered 2021-04-26 – 2021-04-28 (×3): 10 mg via ORAL
  Filled 2021-04-26 (×3): qty 1

## 2021-04-26 NOTE — Plan of Care (Signed)
Shalissa was occasionally tearful today but mostly engaging and conversant with staff. She denies imminent suicidal ideation but states "the thoughts come and go" and does not give a specific plan to this Clinical research associate. Up to BR with stand by assist. Tolerating diet. Echo performed and serial troponins initiated for new T wave inversion noted on EKG. Pt denies CP/pressure/SOB or any other cardiac symptoms.  1:1 sitter remained at bedside the entirety of this shift.  Problem: Education: Goal: Ability to make informed decisions regarding treatment will improve Outcome: Progressing   Problem: Coping: Goal: Coping ability will improve Outcome: Progressing   Problem: Health Behavior/Discharge Planning: Goal: Identification of resources available to assist in meeting health care needs will improve Outcome: Progressing   Problem: Medication: Goal: Compliance with prescribed medication regimen will improve Outcome: Progressing   Problem: Self-Concept: Goal: Ability to disclose and discuss suicidal ideas will improve Outcome: Progressing Goal: Will verbalize positive feelings about self Outcome: Progressing   Problem: Health Behavior/Discharge Planning: Goal: Ability to manage health-related needs will improve Outcome: Progressing   Problem: Clinical Measurements: Goal: Will remain free from infection Outcome: Progressing   Problem: Activity: Goal: Risk for activity intolerance will decrease Outcome: Progressing   Problem: Safety: Goal: Ability to remain free from injury will improve Outcome: Progressing

## 2021-04-26 NOTE — TOC Progression Note (Signed)
Transition of Care [facility]) - Progression Note    Patient Details  Name: Carrie Adkins MRN: [id number] Date of Birth: 06/25/1966  [facility]) CM/SW Contact  Darleene [place], [place] Phone Number: 04/26/2021, 5:32 PM  Clinical Narrative:     CSW contacted [facility] and [facility] they do not have any beds available for patient.  CSW also contacted [facility], and they do not take referrals, patient would have to go to facility and be assessed to see if she can be accepted.  CSW to begin bed search for other inpatient psych facilities.  Per physician patient is currently voluntarily.  CSW to continue to look for placement.        Expected Discharge Plan and Services                                                 Social Determinants of Health (SDOH) Interventions    Readmission Risk Interventions No flowsheet data found.

## 2021-04-26 NOTE — Progress Notes (Signed)
  Echocardiogram 2D Echocardiogram has been performed.  Leta Jungling M 04/26/2021, 2:15 PM

## 2021-04-26 NOTE — Progress Notes (Signed)
PROGRESS NOTE    Carrie Adkins  VQQ:[id number] DOB: 04-Jul-1966 DOA: 04/21/2021 PCP: System, Provider Not In   Brief Narrative: 55 year old female with history of polysubstance abuse, diastolic heart failure, hypertension, COPD, GERD, admitted with multiple complaints generalized weakness diffuse tingling unsteady gait and vaginal discharge.  She lives in a car and uses cocaine on a regular basis.  She has 4 adult children, none that lives with her.  She has 2 sisters that are involved in her care.  She reports that if she was not here she would have killed herself. She also reports having unprotected sex with new vaginal discharge positive for clue cells, bacterial vaginosis.  She is admitted for active suicidal ideations and plans and for psych consult.  Assessment & Plan:   Principal Problem:   Unsteady gait Active Problems:   COPD without exacerbation (HCC)   GERD without esophagitis   Nicotine dependence, cigarettes, uncomplicated   Suicidal ideation   Polysubstance abuse (HCC)   Coronary artery disease involving native coronary artery of native heart   Bacterial vaginosis   Nausea & vomiting  #1 suicidal ideation appreciate psych input.  Started on gabapentin 100 mg bid Continue one-to-one sitter Psych recommends inpatient psych admission when cleared medically.  #2 unsteady gait with frequent falls MRI of the brain does not show any acute findings. Further work-up-unremarkable includes TSH 0.294 A1c 5.4 B12 285 folate 14.5 RPR nonreactive COVID-negative HIV nonreactive  #3 history of COPD without exacerbation stable #4 polysubstance abuse with urine drug screen positive for cocaine amphetamines and THC  #5 CAD with history of diastolic heart failure stable, Dcd IV fluids  #7 bacterial vaginosis on 7-day course of metronidazole bid Wet prep clue cells  #8 ELEVATED LDL-Start lipitor   #9 elevated AST resolved  #10 hypertension -Will DC clonidine and start  norvasc  #11 bradycardia EKG done today with new T wave inversions ant and lateral compared to EKG done 04/22/2021.  Concern for cocaine induced spasm, will avoid beta-blockers.  Cycle troponin and check echo.   Estimated body mass index is 27.44 kg/m as calculated from the following:   Height as of this encounter: 5\' 6"  (1.676 m).   Weight as of this encounter: 77.1 kg.  DVT prophylaxis: lovenox Code Status: full Family Communication: dw sisters  Disposition Plan:  Status is: inpatient  The patient will require care spanning > 2 midnights and should be moved to inpatient because: Altered mental status  Dispo: The patient is from: Home              Anticipated d/c is to:  [facility]              Patient currently is not medically stable to d/c.   Difficult to place patient No   Consultants:  PSYCH  Procedures: NONE Antimicrobials:FLAGYL  Subjective: No chest pain sob nausea  Anxious to to [facility]  Objective: Vitals:   04/25/21 2000 04/25/21 2038 04/26/21 0604 04/26/21 1003  BP:  130/60 140/78 (!) 144/82  Pulse:  73 65 63  Resp:  16 17 16   Temp:  98.2 F (36.8 C) 97.6 F (36.4 C) 98 F (36.7 C)  TempSrc:  Oral Oral Oral  SpO2: 99% 99% 100% 99%  Weight:      Height:        Intake/Output Summary (Last 24 hours) at 04/26/2021 1319 Last data filed at 04/26/2021 1230 Gross per 24 hour  Intake 1140 ml  Output --  Net 1140 ml  Filed Weights   04/22/21 1015  Weight: 77.1 kg    Examination:  General exam: Appears tearful Respiratory system: Clear to auscultation. Respiratory effort normal. Cardiovascular system: S1 & S2 heard, RRR. No JVD, murmurs, rubs, gallops or clicks. No pedal edema. Gastrointestinal system: Abdomen is nondistended, soft and nontender. No organomegaly or masses felt. Normal bowel sounds heard. Central nervous system: Alert and oriented. No focal neurological deficits. Extremities: Symmetric 5 x 5 power. Skin: No rashes, lesions or ulcers Psychiatry:  Judgement and insight appear normal. Mood & affect appropriate.     Data Reviewed: I have personally reviewed following labs and imaging studies  CBC: Recent Labs  Lab 04/21/21 1351 04/22/21 1030 04/23/21 0747 04/25/21 1245  WBC 10.9* 6.4 5.7 6.0  NEUTROABS 6.0 3.4  --   --   HGB 13.5 14.2 12.9 12.4  HCT 41.0 43.0 38.9 37.7  MCV 90.5 92.3 91.5 92.2  PLT 341 287 312 325    Basic Metabolic Panel: Recent Labs  Lab 04/21/21 1351 04/22/21 1030 04/23/21 0747 04/25/21 1245  NA 143 141 140 145  K 3.2* 5.8* 3.7 3.7  CL 109 105 108 112*  CO2 26 24 27 27   GLUCOSE 108* 85 103* 102*  BUN 24* 16 17 15   CREATININE 1.10* 1.02* 0.87 0.88  CALCIUM 9.5 9.0 8.4* 9.2  MG  --  2.4  --   --     GFR: Estimated Creatinine Clearance: 73.9 mL/min (by C-G formula based on SCr of 0.88 mg/dL). Liver Function Tests: Recent Labs  Lab 04/21/21 1351 04/22/21 1030 04/23/21 0747 04/25/21 1245  AST 107* 87* 39 24  ALT 43 36 27 21  ALKPHOS 88 78 70 65  BILITOT 0.7 1.2 0.2* 0.3  PROT 7.7 7.3 6.3* 6.2*  ALBUMIN 4.3 3.8 3.5 3.4*    No results for input(s): LIPASE, AMYLASE in the last 168 hours. Recent Labs  Lab 04/22/21 1113  AMMONIA 34    Coagulation Profile: Recent Labs  Lab 04/21/21 1351  INR 1.0    Cardiac Enzymes: No results for input(s): CKTOTAL, CKMB, CKMBINDEX, TROPONINI in the last 168 hours. BNP (last 3 results) No results for input(s): PROBNP in the last 8760 hours. HbA1C: No results for input(s): HGBA1C in the last 72 hours.  CBG: No results for input(s): GLUCAP in the last 168 hours. Lipid Profile: No results for input(s): CHOL, HDL, LDLCALC, TRIG, CHOLHDL, LDLDIRECT in the last 72 hours.  Thyroid Function Tests: No results for input(s): TSH, T4TOTAL, FREET4, T3FREE, THYROIDAB in the last 72 hours.  Anemia Panel: No results for input(s): VITAMINB12, FOLATE, FERRITIN, TIBC, IRON, RETICCTPCT in the last 72 hours.  Sepsis Labs: No results for input(s):  PROCALCITON, LATICACIDVEN in the last 168 hours.  Recent Results (from the past 240 hour(s))  Wet prep, genital     Status: Abnormal   Collection Time: 04/22/21 12:58 AM   Specimen: Urine, Clean Catch  Result Value Ref Range Status   Yeast Wet Prep HPF POC NONE SEEN NONE SEEN Final   Trich, Wet Prep NONE SEEN NONE SEEN Final   Clue Cells Wet Prep HPF POC PRESENT (A) NONE SEEN Final   WBC, Wet Prep HPF POC NONE SEEN NONE SEEN Final   Sperm NONE SEEN  Final    Comment: Performed at [facility], [address], [place], [place]  Resp Panel by RT-PCR (Flu A&B, Covid) Nasopharyngeal Swab     Status: None   Collection Time: 04/22/21  5:28 AM   Specimen: Nasopharyngeal Swab; Nasopharyngeal(NP) swabs in vial transport medium  Result Value Ref Range Status   SARS Coronavirus 2 by RT PCR NEGATIVE NEGATIVE Final    Comment: (NOTE) SARS-CoV-2 target nucleic acids are NOT DETECTED.  The SARS-CoV-2 RNA is generally detectable in upper respiratory specimens during the acute phase of infection. The lowest concentration of SARS-CoV-2 viral copies this assay can detect is 138 copies/mL. A negative result does not preclude SARS-Cov-2 infection and should not be used as the sole basis for treatment or other patient management decisions. A negative result may occur with  improper specimen collection/handling, submission of specimen other than nasopharyngeal swab, presence of viral mutation(s) within the areas targeted by this assay, and inadequate number of viral copies(<138 copies/mL). A negative result must be combined with clinical observations, patient history, and epidemiological information. The expected result is Negative.  Fact Sheet for Patients:  BloggerCourse.com  Fact Sheet for Healthcare Providers:  SeriousBroker.it  This test is no t yet approved or cleared by the [place] FDA and  has been  authorized for detection and/or diagnosis of SARS-CoV-2 by FDA under an Emergency Use Authorization (EUA). This EUA will remain  in effect (meaning this test can be used) for the duration of the COVID-19 declaration under Section 564(b)(1) of the Act, 21 U.S.C.section 360bbb-3(b)(1), unless the authorization is terminated  or revoked sooner.       Influenza A by PCR NEGATIVE NEGATIVE Final   Influenza B by PCR NEGATIVE NEGATIVE Final    Comment: (NOTE) The Xpert Xpress SARS-CoV-2/FLU/RSV plus assay is intended as an aid in the diagnosis of influenza from Nasopharyngeal swab specimens and should not be used as a sole basis for treatment. Nasal washings and aspirates are unacceptable for Xpert Xpress SARS-CoV-2/FLU/RSV testing.  Fact Sheet for Patients: BloggerCourse.com  Fact Sheet for Healthcare Providers: SeriousBroker.it  This test is not yet approved or cleared by the [place] FDA and has been authorized for detection and/or diagnosis of SARS-CoV-2 by FDA under an Emergency Use Authorization (EUA). This EUA will remain in effect (meaning this test can be used) for the duration of the COVID-19 declaration under Section 564(b)(1) of the Act, 21 U.S.C. section 360bbb-3(b)(1), unless the authorization is terminated or revoked.  Performed at [facility], [address] [place], [place] [address]           Radiology Studies: No results found.      Scheduled Meds:  aspirin EC  81 mg Oral Daily   atorvastatin  10 mg Oral Daily   cloNIDine  0.1 mg Oral TID   enoxaparin (LOVENOX) injection  40 mg Subcutaneous Q24H   gabapentin  100 mg Oral BID   metroNIDAZOLE  500 mg Oral Q12H   Continuous Infusions:     LOS: 3 days    Time spent: 38 min  Alwyn Ren, MD 04/26/2021, 1:19 PM

## 2021-04-26 NOTE — Consult Note (Signed)
[facility] Face-to-Face Psychiatry Consult   Reason for Consult:  SUicidal ideation Referring Physician:  Dr. Molli Hazard Patient Identification: Carrie Adkins MRN:  [id number] Principal Diagnosis: Unsteady gait Diagnosis:  Principal Problem:   Unsteady gait Active Problems:   COPD without exacerbation (HCC)   GERD without esophagitis   Nicotine dependence, cigarettes, uncomplicated   Suicidal ideation   Polysubstance abuse (HCC)   Coronary artery disease involving native coronary artery of native heart   Bacterial vaginosis   Nausea & vomiting   Total Time spent with patient: 15 minutes  Subjective:   Carrie Adkins is a 55 y.o. female patient admitted with vaginal discharge and generalized weakness; psych consulted for SI. Seen yeterday  by psychiaty-started gabapentin.  UDS+THC, cocaine, amphetamines.   Patient is seen and chart reviewed, her sister continues to be present at the bedside.  Patient continues to endorse fleeting suicidal ideations with a plan to end her life.  She also is unable to contract for safety.  She continues to endorse depression, hopelessness, worthlessness, sadness, and insomnia.  She currently denies any withdrawal symptoms.  She denies any mood lability, delusions, paranoia, agitation, and or aggression.  She denies any homicidal ideation, auditory or visual hallucinations, and or delusional thinking disorder.  She continues to meet inpatient criteria for dual diagnosis, as she has since attempted suicide, experiencing depressive symptoms, and has polysubstance abuse.  HPI:  This is a 55 year old female with history of polysubstance abuse COPD nicotine dependence diastolic heart failure hypertension admitted with multiple complaints including numbness confusion with general discharge generalized weakness and suicidal ideations.  Review of her chart indicates her urine drug screen is positive for cocaine amphetamine and THC.  MRI of the brain shows no  acute findings though meningioma may be noted.  This will need outpatient follow-up with contrast MRI.  I have also consulted psych for suicidal ideations.  Past Psychiatric History: Depression and anxiety  Risk to Self: Yes Risk to Others: Denies Prior Inpatient Therapy: Denies Prior Outpatient Therapy: Denies  Past Medical History:  Past Medical History:  Diagnosis Date   COPD without exacerbation (HCC) 03/01/2007   Qualifier: Diagnosis of  By: Beverely Pace MD, Curtis     Coronary artery disease involving native coronary artery of native heart 04/22/2021   Nicotine dependence, cigarettes, uncomplicated 04/22/2021   Polysubstance abuse (HCC) 04/22/2021   History reviewed. No pertinent surgical history. Family History:  Family History  Problem Relation Age of Onset   Heart disease Neg Hx    Family Psychiatric  History: Denies by both patient and her sister. Social History:  Social History   Substance and Sexual Activity  Alcohol Use Not Currently     Social History   Substance and Sexual Activity  Drug Use Yes   Types: Cocaine, Methamphetamines, Marijuana    Social History   Socioeconomic History   Marital status: Legally Separated    Spouse name: Not on file   Number of children: Not on file   Years of education: Not on file   Highest education level: Not on file  Occupational History   Not on file  Tobacco Use   Smoking status: Every Day    Types: Cigarettes   Smokeless tobacco: Never  Vaping Use   Vaping Use: Never used  Substance and Sexual Activity   Alcohol use: Not Currently   Drug use: Yes    Types: Cocaine, Methamphetamines, Marijuana   Sexual activity: Yes    Birth control/protection: None  Other Topics  Concern   Not on file  Social History Narrative   Not on file   Social Determinants of Health   Financial Resource Strain: Not on file  Food Insecurity: Not on file  Transportation Needs: Not on file  Physical Activity: Not on file  Stress: Not on file   Social Connections: Not on file   Additional Social History:    Allergies:  No Known Allergies  Labs:  Results for orders placed or performed during the hospital encounter of 04/21/21 (from the past 48 hour(s))  CBC     Status: None   Collection Time: 04/25/21 12:45 PM  Result Value Ref Range   WBC 6.0 4.0 - 10.5 K/uL   RBC 4.09 3.87 - 5.11 MIL/uL   Hemoglobin 12.4 12.0 - 15.0 g/dL   HCT 78.6 76.7 - 20.9 %   MCV 92.2 80.0 - 100.0 fL   MCH 30.3 26.0 - 34.0 pg   MCHC 32.9 30.0 - 36.0 g/dL   RDW 47.0 96.2 - 83.6 %   Platelets 325 150 - 400 K/uL   nRBC 0.0 0.0 - 0.2 %    Comment: Performed at [facility], [address], [place], [place] [address]  Comprehensive metabolic panel     Status: Abnormal   Collection Time: 04/25/21 12:45 PM  Result Value Ref Range   Sodium 145 135 - 145 mmol/L   Potassium 3.7 3.5 - 5.1 mmol/L   Chloride 112 (H) 98 - 111 mmol/L   CO2 27 22 - 32 mmol/L   Glucose, Bld 102 (H) 70 - 99 mg/dL    Comment: Glucose reference range applies only to samples taken after fasting for at least 8 hours.   BUN 15 6 - 20 mg/dL   Creatinine, Ser 6.54 0.44 - 1.00 mg/dL   Calcium 9.2 8.9 - 65.0 mg/dL   Total Protein 6.2 (L) 6.5 - 8.1 g/dL   Albumin 3.4 (L) 3.5 - 5.0 g/dL   AST 24 15 - 41 U/L   ALT 21 0 - 44 U/L   Alkaline Phosphatase 65 38 - 126 U/L   Total Bilirubin 0.3 0.3 - 1.2 mg/dL   GFR, Estimated >35 >46 mL/min    Comment: (NOTE) Calculated using the CKD-EPI Creatinine Equation (2021)    Anion gap 6 5 - 15    Comment: Performed at [facility], [address], [place], [place] [address]  Troponin I (High Sensitivity)     Status: None   Collection Time: 04/26/21  2:11 PM  Result Value Ref Range   Troponin I (High Sensitivity) 5 <18 ng/L    Comment: (NOTE) Elevated high sensitivity troponin I (hsTnI) values and significant  changes across serial measurements may suggest ACS but many other  chronic and acute  conditions are known to elevate hsTnI results.  Refer to the "Links" section for chest pain algorithms and additional  guidance. Performed at [facility], [address] [place], [place] [address]     Current Facility-Administered Medications  Medication Dose Route Frequency Provider Last Rate Last Admin   acetaminophen (TYLENOL) tablet 650 mg  650 mg Oral Q6H PRN Marinda Elk, MD   650 mg at 04/26/21 0174   Or   acetaminophen (TYLENOL) suppository 650 mg  650 mg Rectal Q6H PRN Marinda Elk, MD       albuterol (PROVENTIL) (2.5 MG/3ML) 0.083% nebulizer solution 3 mL  3 mL Nebulization Q6H PRN Alwyn Ren, MD  aspirin EC tablet 81 mg  81 mg Oral Daily Shalhoub, Deno Lunger, MD   81 mg at 04/26/21 1004   atorvastatin (LIPITOR) tablet 10 mg  10 mg Oral Daily Alwyn Ren, MD   10 mg at 04/26/21 1004   enoxaparin (LOVENOX) injection 40 mg  40 mg Subcutaneous Q24H Shalhoub, Deno Lunger, MD   40 mg at 04/26/21 1009   gabapentin (NEURONTIN) capsule 100 mg  100 mg Oral BID Maryagnes Amos, FNP   100 mg at 04/26/21 1004   guaiFENesin-dextromethorphan (ROBITUSSIN DM) 100-10 MG/5ML syrup 5 mL  5 mL Oral Q4H PRN Alwyn Ren, MD   5 mL at 04/25/21 0457   lisinopril (ZESTRIL) tablet 10 mg  10 mg Oral Daily Alwyn Ren, MD   10 mg at 04/26/21 1414   metroNIDAZOLE (FLAGYL) tablet 500 mg  500 mg Oral Q12H Shalhoub, Deno Lunger, MD   500 mg at 04/26/21 1005   ondansetron (ZOFRAN) tablet 4 mg  4 mg Oral Q6H PRN Shalhoub, Deno Lunger, MD       Or   ondansetron [facility]) injection 4 mg  4 mg Intravenous Q6H PRN Shalhoub, Deno Lunger, MD       polyethylene glycol (MIRALAX / GLYCOLAX) packet 17 g  17 g Oral Daily PRN Shalhoub, Deno Lunger, MD        Musculoskeletal: Strength & Muscle Tone: within normal limits Gait & Station: normal Patient leans: N/A      Psychiatric Specialty Exam:  Presentation  General Appearance: Appropriate for  Environment; Casual  Eye Contact:Fair  Speech:Clear and Coherent; Normal Rate  Speech Volume:Normal  Handedness:Right   Mood and Affect  Mood:Euthymic  Affect:Appropriate; Congruent   Thought Process  Thought Processes:Coherent; Linear  Descriptions of Associations:Intact  Orientation:Full (Time, Place and Person)  Thought Content:Logical  History of Schizophrenia/Schizoaffective disorder:No data recorded Duration of Psychotic Symptoms:No data recorded Hallucinations:Hallucinations: None  Ideas of Reference:None  Suicidal Thoughts:Suicidal Thoughts: Yes, Passive SI Passive Intent and/or Plan: With Intent; With Plan; With Access to Means  Homicidal Thoughts:Homicidal Thoughts: No   Sensorium  Memory:Immediate Good; Recent Good; Remote Fair  Judgment:Poor  Insight:Poor   Executive Functions  Concentration:Fair  Attention Span:Fair  Recall:Fair  Fund of Knowledge:Fair  Language:Fair   Psychomotor Activity  Psychomotor Activity:Psychomotor Activity: Normal   Assets  Assets:Communication Skills; Financial Resources/Insurance; Housing; Physical Health   Sleep  Sleep:Sleep: Fair   Physical Exam: Physical Exam Constitutional:      Appearance: Normal appearance. She is normal weight.  HENT:     Head: Normocephalic and atraumatic.  Pulmonary:     Effort: Pulmonary effort is normal.  Neurological:     Mental Status: She is alert.   Review of Systems  Psychiatric/Behavioral:  Positive for depression, substance abuse and suicidal ideas. Negative for hallucinations.   Blood pressure 138/73, pulse 67, temperature 97.6 F (36.4 C), temperature source Oral, resp. rate (!) 22, height 5\' 6"  (1.676 m), weight 77.1 kg, SpO2 99 %. Body mass index is 27.44 kg/m.  Treatment Plan Summary: Daily contact with patient to assess and evaluate symptoms and progress in treatment, Medication management, and Plan will recommend inpatient psychiatric admission  once medically stable as she is unable to contract for safety  -Continue to monitor patient closely for substance use withdrawal symptoms.  Please expect agitation, likely verbal aggression, and mood lability as patient begins to withdraw. -Continue one-to-one safety sitter at this time as patient is unable to contract for safety. -Continue gabapentin  100 mg p.o. twice daily  Disposition: Recommend psychiatric Inpatient admission when medically cleared.-consult TOC to facilitate placement   Maryagnes Amosakia S Starkes-Perry, FNP  04/26/2021 3:36 PM

## 2021-04-27 NOTE — Progress Notes (Signed)
PT Cancellation/ScreenNote  Patient Details Name: Kalya Troeger MRN: [id number] DOB: [birth year]   Cancelled Treatment:    Reason Eval/Treat Not Completed: PT screened, no needs identified, will sign off. Pt is ambulating hallways with sitter-no device. Will sign off. Please reorder if mobility needs change.    Faye Ramsay, PT Acute Rehabilitation  Office: [phone] Pager: [phone]

## 2021-04-27 NOTE — Plan of Care (Signed)
Carrie Adkins was tearful early this morning but has been in a more engaging mood since walking in the hall a couple of times this morning. She denies SI/HI today. Expresses that she hopes to have a bed at [facility] soon.   Problem: Education: Goal: Ability to make informed decisions regarding treatment will improve Outcome: Progressing   Problem: Coping: Goal: Coping ability will improve Outcome: Progressing   Problem: Health Behavior/Discharge Planning: Goal: Identification of resources available to assist in meeting health care needs will improve Outcome: Progressing   Problem: Medication: Goal: Compliance with prescribed medication regimen will improve Outcome: Progressing   Problem: Health Behavior/Discharge Planning: Goal: Ability to manage health-related needs will improve Outcome: Progressing   Problem: Activity: Goal: Risk for activity intolerance will decrease Outcome: Progressing   Problem: Safety: Goal: Ability to remain free from injury will improve Outcome: Progressing

## 2021-04-27 NOTE — Progress Notes (Signed)
PROGRESS NOTE    Carrie Adkins  TML:[id number] DOB: [birth year] DOA: 04/21/2021 PCP: System, Provider Not In   Brief Narrative: 55 year old female with history of polysubstance abuse, diastolic heart failure, hypertension, COPD, GERD, admitted with multiple complaints generalized weakness diffuse tingling unsteady gait and vaginal discharge.  She lives in a car and uses cocaine on a regular basis.  She has 4 adult children, none that lives with her.  She has 2 sisters that are involved in her care.  She reports that if she was not here she would have killed herself. She also reports having unprotected sex with new vaginal discharge positive for clue cells, bacterial vaginosis.  She is admitted for active suicidal ideations and plans and for psych consult.  Assessment & Plan:   Principal Problem:   Unsteady gait Active Problems:   COPD without exacerbation (HCC)   GERD without esophagitis   Nicotine dependence, cigarettes, uncomplicated   Suicidal ideation   Polysubstance abuse (HCC)   Coronary artery disease involving native coronary artery of native heart   Bacterial vaginosis   Nausea & vomiting   Altered mental status  #1 suicidal ideation appreciate psych input.  Started on gabapentin 100 mg bid Continue one-to-one sitter Psych recommends inpatient psych admission when cleared medically.she is medically cleared for dc.  #2 unsteady gait with frequent falls MRI of the brain does not show any acute findings. Further work-up-unremarkable includes TSH 0.294 A1c 5.4 B12 285 folate 14.5 RPR nonreactive COVID-negative HIV nonreactive  #3 history of COPD without exacerbation stable #4 polysubstance abuse with urine drug screen positive for cocaine amphetamines and THC  #5 CAD with history of diastolic heart failure stable, Dcd IV fluids  #7 bacterial vaginosis on 7-day course of metronidazole bid Wet prep clue cells  #8 ELEVATED LDL-continue lipitor   #9 elevated AST  resolved  #10 hypertension -Will DC clonidine and start norvasc  #11 bradycardia -resolved after taking off the clonidine.  EKG today with less pronounced T wave inversion in the anterolateral leads.  Cardiac enzymes troponins negative.  Echo noted with hyperdynamic EF.    Estimated body mass index is 27.44 kg/m as calculated from the following:   Height as of this encounter: 5\' 6"  (1.676 m).   Weight as of this encounter: 77.1 kg.  DVT prophylaxis: lovenox Code Status: full Family Communication: dw sisters  Disposition Plan:  Status is: inpatient  The patient will require care spanning > 2 midnights and should be moved to inpatient because: Altered mental status  Dispo: The patient is from: Home              Anticipated d/c is to:  [facility]              Patient currently is not medically stable to d/c.   Difficult to place patient No   Consultants:  PSYCH  Procedures: NONE Antimicrobials:FLAGYL  Subjective:  She is sitting up in bed eating breakfast she is a bit tearful today She denies shortness of breath or chest pain Objective: Vitals:   04/26/21 2128 04/27/21 0610 04/27/21 0920 04/27/21 1300  BP: 139/69 114/63 140/88 (!) 147/92  Pulse: 70 (!) 59 64 74  Resp: 18 20  16   Temp: 98 F (36.7 C) 98.4 F (36.9 C)  98.2 F (36.8 C)  TempSrc: Oral Oral  Oral  SpO2: 100% 100% 100% 100%  Weight:      Height:        Intake/Output Summary (Last 24 hours) at  04/27/2021 1356 Last data filed at 04/27/2021 1230 Gross per 24 hour  Intake 940 ml  Output --  Net 940 ml    Filed Weights   04/22/21 1015  Weight: 77.1 kg    Examination:  General exam: Appears tearful Respiratory system: Clear to auscultation. Respiratory effort normal. Cardiovascular system: S1 & S2 heard, RRR. No JVD, murmurs, rubs, gallops or clicks. No pedal edema. Gastrointestinal system: Abdomen is nondistended, soft and nontender. No organomegaly or masses felt. Normal bowel sounds heard. Central  nervous system: Alert and oriented. No focal neurological deficits. Extremities: Symmetric 5 x 5 power. Skin: No rashes, lesions or ulcers Psychiatry: Judgement and insight appear normal. Mood & affect appropriate.     Data Reviewed: I have personally reviewed following labs and imaging studies  CBC: Recent Labs  Lab 04/21/21 1351 04/22/21 1030 04/23/21 0747 04/25/21 1245  WBC 10.9* 6.4 5.7 6.0  NEUTROABS 6.0 3.4  --   --   HGB 13.5 14.2 12.9 12.4  HCT 41.0 43.0 38.9 37.7  MCV 90.5 92.3 91.5 92.2  PLT 341 287 312 325    Basic Metabolic Panel: Recent Labs  Lab 04/21/21 1351 04/22/21 1030 04/23/21 0747 04/25/21 1245  NA 143 141 140 145  K 3.2* 5.8* 3.7 3.7  CL 109 105 108 112*  CO2 26 24 27 27   GLUCOSE 108* 85 103* 102*  BUN 24* 16 17 15   CREATININE 1.10* 1.02* 0.87 0.88  CALCIUM 9.5 9.0 8.4* 9.2  MG  --  2.4  --   --     GFR: Estimated Creatinine Clearance: 73.9 mL/min (by C-G formula based on SCr of 0.88 mg/dL). Liver Function Tests: Recent Labs  Lab 04/21/21 1351 04/22/21 1030 04/23/21 0747 04/25/21 1245  AST 107* 87* 39 24  ALT 43 36 27 21  ALKPHOS 88 78 70 65  BILITOT 0.7 1.2 0.2* 0.3  PROT 7.7 7.3 6.3* 6.2*  ALBUMIN 4.3 3.8 3.5 3.4*    No results for input(s): LIPASE, AMYLASE in the last 168 hours. Recent Labs  Lab 04/22/21 1113  AMMONIA 34    Coagulation Profile: Recent Labs  Lab 04/21/21 1351  INR 1.0    Cardiac Enzymes: No results for input(s): CKTOTAL, CKMB, CKMBINDEX, TROPONINI in the last 168 hours. BNP (last 3 results) No results for input(s): PROBNP in the last 8760 hours. HbA1C: No results for input(s): HGBA1C in the last 72 hours.  CBG: No results for input(s): GLUCAP in the last 168 hours. Lipid Profile: No results for input(s): CHOL, HDL, LDLCALC, TRIG, CHOLHDL, LDLDIRECT in the last 72 hours.  Thyroid Function Tests: No results for input(s): TSH, T4TOTAL, FREET4, T3FREE, THYROIDAB in the last 72 hours.  Anemia  Panel: No results for input(s): VITAMINB12, FOLATE, FERRITIN, TIBC, IRON, RETICCTPCT in the last 72 hours.  Sepsis Labs: No results for input(s): PROCALCITON, LATICACIDVEN in the last 168 hours.  Recent Results (from the past 240 hour(s))  Wet prep, genital     Status: Abnormal   Collection Time: 04/22/21 12:58 AM   Specimen: Urine, Clean Catch  Result Value Ref Range Status   Yeast Wet Prep HPF POC NONE SEEN NONE SEEN Final   Trich, Wet Prep NONE SEEN NONE SEEN Final   Clue Cells Wet Prep HPF POC PRESENT (A) NONE SEEN Final   WBC, Wet Prep HPF POC NONE SEEN NONE SEEN Final   Sperm NONE SEEN  Final    Comment: Performed at [facility], [address]  [place], [place] [address]  Resp Panel by RT-PCR (Flu A&B, Covid) Nasopharyngeal Swab     Status: None   Collection Time: 04/22/21  5:28 AM   Specimen: Nasopharyngeal Swab; Nasopharyngeal(NP) swabs in vial transport medium  Result Value Ref Range Status   SARS Coronavirus 2 by RT PCR NEGATIVE NEGATIVE Final    Comment: (NOTE) SARS-CoV-2 target nucleic acids are NOT DETECTED.  The SARS-CoV-2 RNA is generally detectable in upper respiratory specimens during the acute phase of infection. The lowest concentration of SARS-CoV-2 viral copies this assay can detect is 138 copies/mL. A negative result does not preclude SARS-Cov-2 infection and should not be used as the sole basis for treatment or other patient management decisions. A negative result may occur with  improper specimen collection/handling, submission of specimen other than nasopharyngeal swab, presence of viral mutation(s) within the areas targeted by this assay, and inadequate number of viral copies(<138 copies/mL). A negative result must be combined with clinical observations, patient history, and epidemiological information. The expected result is Negative.  Fact Sheet for Patients:  BloggerCourse.com  Fact Sheet for Healthcare  Providers:  SeriousBroker.it  This test is no t yet approved or cleared by the [place] FDA and  has been authorized for detection and/or diagnosis of SARS-CoV-2 by FDA under an Emergency Use Authorization (EUA). This EUA will remain  in effect (meaning this test can be used) for the duration of the COVID-19 declaration under Section 564(b)(1) of the Act, 21 U.S.C.section 360bbb-3(b)(1), unless the authorization is terminated  or revoked sooner.       Influenza A by PCR NEGATIVE NEGATIVE Final   Influenza B by PCR NEGATIVE NEGATIVE Final    Comment: (NOTE) The Xpert Xpress SARS-CoV-2/FLU/RSV plus assay is intended as an aid in the diagnosis of influenza from Nasopharyngeal swab specimens and should not be used as a sole basis for treatment. Nasal washings and aspirates are unacceptable for Xpert Xpress SARS-CoV-2/FLU/RSV testing.  Fact Sheet for Patients: BloggerCourse.com  Fact Sheet for Healthcare Providers: SeriousBroker.it  This test is not yet approved or cleared by the [place] FDA and has been authorized for detection and/or diagnosis of SARS-CoV-2 by FDA under an Emergency Use Authorization (EUA). This EUA will remain in effect (meaning this test can be used) for the duration of the COVID-19 declaration under Section 564(b)(1) of the Act, 21 U.S.C. section 360bbb-3(b)(1), unless the authorization is terminated or revoked.  Performed at [facility], [address] [place], [place] [address]           Radiology Studies: ECHOCARDIOGRAM COMPLETE  Result Date: 04/26/2021    ECHOCARDIOGRAM REPORT   Patient Name:   Carrie Adkins Date of Exam: 04/26/2021 Medical Rec #:  [id number]              Height:       66.0 in Accession #:    [id number]             Weight:       170.0 lb Date of Birth:  [birth year]              BSA:          1.866 m Patient Age:    55  years               BP:           114/82 mmHg Patient Gender: F  HR:           63 bpm. Exam Location:  Inpatient Procedure: 2D Echo, 3D Echo, Cardiac Doppler and Color Doppler Indications:    Abnormal ECG R94.31  History:        Patient has prior history of Echocardiogram examinations, most                 recent 03/20/2019. COPD; Risk Factors:Current Smoker and                 Hypertension.  Sonographer:    Leta Jungling RDCS Referring Phys: [id number] Lucia G Phuoc Huy IMPRESSIONS  1. Left ventricular ejection fraction, by estimation, is >75%. The left ventricle has hyperdynamic function. The left ventricle has no regional wall motion abnormalities. There is severe concentric left ventricular hypertrophy. Left ventricular diastolic parameters are consistent with Grade I diastolic dysfunction (impaired relaxation).  2. Right ventricular systolic function is normal. The right ventricular size is normal. There is normal pulmonary artery systolic pressure. The estimated right ventricular systolic pressure is 31.2 mmHg.  3. The mitral valve is grossly normal. Mild mitral valve regurgitation.  4. The aortic valve was not well visualized. Aortic valve regurgitation is not visualized. No aortic stenosis is present.  5. The inferior vena cava is normal in size with <50% respiratory variability, suggesting right atrial pressure of 8 mmHg. Comparison(s): Prior images unable to be directly viewed, comparison made by report only. Changes from prior study are noted. 03/20/2019 [facility]): LVEF >55%. FINDINGS  Left Ventricle: Left ventricular ejection fraction, by estimation, is >75%. The left ventricle has hyperdynamic function. The left ventricle has no regional wall motion abnormalities. The left ventricular internal cavity size was small. There is severe concentric left ventricular hypertrophy. Left ventricular diastolic parameters are consistent with Grade I diastolic dysfunction (impaired  relaxation). Indeterminate filling pressures. Right Ventricle: The right ventricular size is normal. No increase in right ventricular wall thickness. Right ventricular systolic function is normal. There is normal pulmonary artery systolic pressure. The tricuspid regurgitant velocity is 2.41 m/s, and  with an assumed right atrial pressure of 8 mmHg, the estimated right ventricular systolic pressure is 31.2 mmHg. Left Atrium: Left atrial size was normal in size. Right Atrium: Right atrial size was normal in size. Pericardium: There is no evidence of pericardial effusion. Mitral Valve: The mitral valve is grossly normal. Mild mitral valve regurgitation. Tricuspid Valve: The tricuspid valve is grossly normal. Tricuspid valve regurgitation is mild. Aortic Valve: The aortic valve was not well visualized. Aortic valve regurgitation is not visualized. No aortic stenosis is present. Pulmonic Valve: The pulmonic valve was normal in structure. Pulmonic valve regurgitation is not visualized. Aorta: The aortic root and ascending aorta are structurally normal, with no evidence of dilitation. Venous: The inferior vena cava is normal in size with less than 50% respiratory variability, suggesting right atrial pressure of 8 mmHg. IAS/Shunts: No atrial level shunt detected by color flow Doppler.  LEFT VENTRICLE PLAX 2D LVIDd:         4.10 cm  Diastology LVIDs:         2.80 cm  LV e' medial:    7.18 cm/s LV PW:         1.50 cm  LV E/e' medial:  10.9 LV IVS:        1.50 cm  LV e' lateral:   7.88 cm/s LVOT diam:     2.00 cm  LV E/e' lateral: 9.9 LV SV:  80 LV SV Index:   43 LVOT Area:     3.14 cm                          3D Volume EF:                         3D EF:        66 %                         LV EDV:       132 ml                         LV ESV:       45 ml                         LV SV:        86 ml RIGHT VENTRICLE RV S prime:     11.50 cm/s TAPSE (M-mode): 2.2 cm LEFT ATRIUM           Index       RIGHT ATRIUM            Index LA diam:      3.80 cm 2.04 cm/m  RA Area:     11.40 cm LA Vol (A4C): 29.1 ml 15.59 ml/m RA Volume:   25.20 ml  13.50 ml/m  AORTIC VALVE LVOT Vmax:   138.00 cm/s LVOT Vmean:  83.500 cm/s LVOT VTI:    0.256 m  AORTA Ao Root diam: 2.70 cm Ao Asc diam:  2.80 cm MITRAL VALVE               TRICUSPID VALVE MV Area (PHT): 5.06 cm    TR Peak grad:   23.2 mmHg MV Decel Time: 150 msec    TR Vmax:        241.00 cm/s MV E velocity: 78.00 cm/s MV A velocity: 74.60 cm/s  SHUNTS MV E/A ratio:  1.05        Systemic VTI:  0.26 m                            Systemic Diam: 2.00 cm Zoila Shutter MD Electronically signed by Zoila Shutter MD Signature Date/Time: 04/26/2021/3:43:15 PM    Final         Scheduled Meds:  aspirin EC  81 mg Oral Daily   atorvastatin  10 mg Oral Daily   enoxaparin (LOVENOX) injection  40 mg Subcutaneous Q24H   gabapentin  100 mg Oral BID   lisinopril  10 mg Oral Daily   metroNIDAZOLE  500 mg Oral Q12H   Continuous Infusions:     LOS: 4 days    Time spent: 38 min  Alwyn Ren, MD 04/27/2021, 1:56 PM

## 2021-04-27 NOTE — TOC Progression Note (Addendum)
Transition of Care [facility]) - Progression Note    Patient Details  Name: Carrie Adkins MRN: [id number] Date of Birth: 10/16/1965  Transition of Care [facility]) CM/SW Contact  Darleene Cleaver, [place] Phone Number: 04/27/2021, 2:57 PM  Clinical Narrative:     CSW contacted [facility] disposition to assist with finding bed placement.  CSW awaiting for results.        Expected Discharge Plan and Services  Inpatient psych placement.                                               Social Determinants of Health (SDOH) Interventions    Readmission Risk Interventions No flowsheet data found.

## 2021-04-28 ENCOUNTER — Inpatient Hospital Stay (HOSPITAL_COMMUNITY)
Admission: AD | Admit: 2021-04-28 | Discharge: 2021-05-04 | DRG: 885 | Disposition: A | Discharge status: Home / Self Care | Payer: Medicaid Other | Source: Intra-hospital | Attending: Emergency Medicine | Admitting: Emergency Medicine

## 2021-04-28 DIAGNOSIS — F17210 Nicotine dependence, cigarettes, uncomplicated: Secondary | ICD-10-CM | POA: Diagnosis present

## 2021-04-28 DIAGNOSIS — F1994 Other psychoactive substance use, unspecified with psychoactive substance-induced mood disorder: Secondary | ICD-10-CM

## 2021-04-28 DIAGNOSIS — J449 Chronic obstructive pulmonary disease, unspecified: Secondary | ICD-10-CM | POA: Diagnosis present

## 2021-04-28 DIAGNOSIS — R45851 Suicidal ideations: Secondary | ICD-10-CM | POA: Diagnosis present

## 2021-04-28 DIAGNOSIS — F419 Anxiety disorder, unspecified: Secondary | ICD-10-CM | POA: Diagnosis present

## 2021-04-28 DIAGNOSIS — F1420 Cocaine dependence, uncomplicated: Secondary | ICD-10-CM | POA: Diagnosis present

## 2021-04-28 DIAGNOSIS — Z20822 Contact with and (suspected) exposure to covid-19: Secondary | ICD-10-CM | POA: Diagnosis present

## 2021-04-28 DIAGNOSIS — F331 Major depressive disorder, recurrent, moderate: Secondary | ICD-10-CM | POA: Diagnosis not present

## 2021-04-28 DIAGNOSIS — Z7982 Long term (current) use of aspirin: Secondary | ICD-10-CM | POA: Diagnosis not present

## 2021-04-28 DIAGNOSIS — I5032 Chronic diastolic (congestive) heart failure: Secondary | ICD-10-CM | POA: Diagnosis present

## 2021-04-28 DIAGNOSIS — I110 Hypertensive heart disease with heart failure: Secondary | ICD-10-CM | POA: Diagnosis present

## 2021-04-28 DIAGNOSIS — Z9151 Personal history of suicidal behavior: Secondary | ICD-10-CM | POA: Diagnosis not present

## 2021-04-28 DIAGNOSIS — N760 Acute vaginitis: Secondary | ICD-10-CM | POA: Diagnosis not present

## 2021-04-28 DIAGNOSIS — F1910 Other psychoactive substance abuse, uncomplicated: Secondary | ICD-10-CM | POA: Diagnosis present

## 2021-04-28 DIAGNOSIS — Z79899 Other long term (current) drug therapy: Secondary | ICD-10-CM

## 2021-04-28 DIAGNOSIS — F332 Major depressive disorder, recurrent severe without psychotic features: Secondary | ICD-10-CM | POA: Diagnosis present

## 2021-04-28 DIAGNOSIS — I2510 Atherosclerotic heart disease of native coronary artery without angina pectoris: Secondary | ICD-10-CM | POA: Diagnosis not present

## 2021-04-28 DIAGNOSIS — R2681 Unsteadiness on feet: Secondary | ICD-10-CM | POA: Diagnosis not present

## 2021-04-28 LAB — RESP PANEL BY RT-PCR (FLU A&B, COVID) ARPGX2
Influenza A by PCR: NEGATIVE
Influenza B by PCR: NEGATIVE
SARS Coronavirus 2 by RT PCR: NEGATIVE

## 2021-04-28 MED ORDER — ATORVASTATIN CALCIUM 10 MG PO TABS
10.0000 mg | ORAL_TABLET | Freq: Every day | ORAL | 1 refills | Status: DC
Start: 2021-04-29 — End: 2021-05-03

## 2021-04-28 MED ORDER — GABAPENTIN 100 MG PO CAPS: 100.0000 mg | ORAL_CAPSULE | Freq: Two times a day (BID) | ORAL | Status: DC

## 2021-04-28 MED ORDER — ASPIRIN 81 MG PO TBEC
81.0000 mg | DELAYED_RELEASE_TABLET | Freq: Every day | ORAL | 11 refills | Status: DC
Start: 2021-04-29 — End: 2021-05-04

## 2021-04-28 MED ORDER — ACETAMINOPHEN 325 MG PO TABS: 650.0000 mg | ORAL_TABLET | Freq: Four times a day (QID) | ORAL | Status: DC | PRN

## 2021-04-28 MED ORDER — LISINOPRIL 20 MG PO TABS: 20.0000 mg | ORAL_TABLET | Freq: Every day | ORAL | 1 refills | Status: DC

## 2021-04-28 NOTE — TOC Transition Note (Signed)
Transition of Care [facility]) - CM/SW Discharge Note   Patient Details  Name: Kaleesi Guyton MRN: [id number] Date of Birth: 05/03/1966  Transition of Care [facility]) CM/SW Contact:  Darleene Cleaver, LCSW Phone Number: 04/28/2021, 5:18 PM   Clinical Narrative:    CSW was informed that patient has bed available at [facility], patient is still agreeable to going.  CSW had patient sign [facility] voluntary paperwork.  Patient will be going to [facility] pending negative Covid test.  Patient has signed transportation waiver, it was emailed to transportation services.  Patient also has signed the voluntary [facility] paperwork, original is in packet for patient, and has been faxed to [facility].  Voluntary [facility] was faxed to [phone], confirmation confirmed that it has been received.  Patient will be transported by safe transport, bedside nurse to call [phone] to arrange transportation to [facility].  Patient will be going to room 401-2, nurse to call report at [phone] before safe transport picks up patient.  CSW signing off please reconsult for social work needs.   Final next level of care: [facility] Barriers to Discharge: Barriers Resolved   Patient Goals and CMS Choice Patient states their goals for this hospitalization and ongoing recovery are:: To go to inpatient behavioral health then return back home. CMS Medicare.gov Compare Post Acute Care list provided to:: Patient Choice offered to / list presented to : Patient  Discharge Placement                       Discharge Plan and Services                                     Social Determinants of Health (SDOH) Interventions     Readmission Risk Interventions No flowsheet data found.

## 2021-04-28 NOTE — Plan of Care (Signed)
  Problem: Education: Goal: Ability to make informed decisions regarding treatment will improve Outcome: Adequate for Discharge   Problem: Coping: Goal: Coping ability will improve Outcome: Adequate for Discharge   Problem: Health Behavior/Discharge Planning: Goal: Identification of resources available to assist in meeting health care needs will improve Outcome: Adequate for Discharge   Problem: Medication: Goal: Compliance with prescribed medication regimen will improve Outcome: Adequate for Discharge   Problem: Self-Concept: Goal: Ability to disclose and discuss suicidal ideas will improve Outcome: Adequate for Discharge Goal: Will verbalize positive feelings about self Outcome: Adequate for Discharge   Problem: Health Behavior/Discharge Planning: Goal: Ability to manage health-related needs will improve Outcome: Adequate for Discharge   

## 2021-04-28 NOTE — Plan of Care (Signed)
  Problem: Education: Goal: Ability to make informed decisions regarding treatment will improve Outcome: Adequate for Discharge   Problem: Coping: Goal: Coping ability will improve Outcome: Adequate for Discharge   Problem: Health Behavior/Discharge Planning: Goal: Identification of resources available to assist in meeting health care needs will improve Outcome: Adequate for Discharge   Problem: Medication: Goal: Compliance with prescribed medication regimen will improve Outcome: Adequate for Discharge   Problem: Self-Concept: Goal: Ability to disclose and discuss suicidal ideas will improve Outcome: Adequate for Discharge Goal: Will verbalize positive feelings about self Outcome: Adequate for Discharge   Problem: Health Behavior/Discharge Planning: Goal: Ability to manage health-related needs will improve Outcome: Adequate for Discharge

## 2021-04-28 NOTE — Progress Notes (Signed)
Pt has been accepted to [facility] 04/28/21 PENDING negative COVID-19 results.  CSW advised that COVID-19 results are still pending at this time.   -Pt meet inpatient behavioral health criteria per Caryn Bee, FNP.  Pt accepted to [facility] 401-2 Accepting physician: Dr. Lucianne Muss -Attending provider Phone number for report: [phone] Per first shift LCSW; Dannial Monarch, RN notified of accepting information.  CSW advised by secure chat that Reino Kent, LCSW will fax signed voluntary form and have pt bring the original with her.   CSW will continue to assist and follow until pt arrives at [facility] for admission.

## 2021-04-28 NOTE — Progress Notes (Signed)
Called Transport Services and left a VM at [phone] to arrange pt transport to [facility].

## 2021-04-28 NOTE — Discharge Summary (Signed)
Physician Discharge Summary  Carrie Adkins ZOX:[id number] DOB: December 11, 1965 DOA: 04/21/2021  PCP: System, Provider Not In  Admit date: 04/21/2021 Discharge date: 04/28/2021  Time spent: 55 minutes  Recommendations for Outpatient Follow-up:  Patient will be discharged to inpatient psychiatric unit. Outpatient follow-up with PCP 2 weeks post discharge from inpatient psychiatric unit.   Discharge Diagnoses:  Principal Problem:   Unsteady gait Active Problems:   COPD without exacerbation (HCC)   GERD without esophagitis   Nicotine dependence, cigarettes, uncomplicated   Suicidal ideation   Polysubstance abuse (HCC)   Coronary artery disease involving native coronary artery of native heart   Bacterial vaginosis   Nausea & vomiting   Altered mental status   Discharge Condition: Stable and improved.  Diet recommendation: Heart healthy  Filed Weights   04/22/21 1015  Weight: 77.1 kg    History of present illness:  HPI per Dr. Leafy Half 55 year old female with past medical history of COPD, polysubstance abuse, gastroesophageal reflux disease, hypertension, nicotine dependence and diastolic congestive heart failure who presents to [facility] emergency department with multiple complaints including diffuse tingling, unsteady gait and vaginal discharge.  Per my discussion with the patient she explains that she has been having increasing difficulty with balance while ambulating for the past several months.  She states that she is frequently falling as a result.  Patient is additionally complaining of associated generalized tingling of the extremities.  Patient denies headaches, slurred speech or visual changes.  Patient denies back pain, bowel or bladder incontinence.   Patient states that she typically lives in her car and regularly uses cocaine on a daily basis.  She states that as of late she has felt that, "I am no good anyone and I do not want to be a burden on anyone  anymore."  Patient states that she has access to "pills" that she she plans on ingesting.   Because of the aforementioned complaints patient eventually [facility].   Upon evaluation in the emergency department CT head was performed and found to be unremarkable.  Patient reported having recent unprotected sex with new onset vaginal discharge and asked to be checked for STDs and therefore a pelvic exam was performed.  Vitamin B12, vitamin B1, RPR and urinalysis were obtained.  Due to concerns for suicidal ideation as well as progressive weakness with frequent falls and hitting ongoing cocaine use in the hospitalist group has been called to assess the patient for admission to the hospital.   Hospital Course:  #1 suicidal ideation -Patient presented with suicidal ideation with intent. -One-to-one sitter was placed throughout the hospitalization. -Psychiatry was consulted and recommendations were made to start patient on gabapentin 100 mg twice daily which patient was started on with further recommendations for inpatient psychiatric admission once patient is medically cleared. -Patient remained stable and currently medically cleared and will be discharged to inpatient psychiatry unit.  2.  Unsteady gait with frequent falls -CT head which was done was unremarkable.  MRI brain which was done was negative for any acute abnormalities. -TSH obtained within normal limits at 0.294.  Hemoglobin A1c noted at 5.4.  Vitamin B12 at 285 and folate at 14.5.  RPR noted to be nonreactive.  COVID-negative.  HIV nonreactive. -Outpatient follow-up.  3.  History of COPD -Remained stable.  4.  Polysubstance abuse -Patient with history of polysubstance abuse. -UDS was positive for cocaine, amphetamines and THC. -Patient remained stable during the hospitalization did not undergo  any significant withdrawal. -Patient will be discharged to inpatient psychiatric  unit.  5.  Coronary artery disease with history of diastolic heart failure -Remained stable. -Patient's clonidine was discontinued and patient started on ACE inhibitor. -Outpatient follow-up with PCP/cardiology.  6.  Bacterial vaginosis -Patient completed a 7-day course of metronidazole during the hospitalization. -Outpatient follow-up with PCP.  7.  Elevated LDL -Patient maintained on statin.  8.  Hypertension -Patient's home regimen clonidine was discontinued due to bradycardia and patient started on lisinopril and dose uptitrated to 20 mg daily. -Outpatient follow-up with PCP.  9.  Bradycardia -Felt likely secondary to clonidine. -Bradycardia resolved after clonidine discontinued. -Repeat EKG done showed less pronounced T wave inversions in the anterior lateral leads. -Cardiac enzymes which were cycled were negative. -2D echo obtained with a hyperdynamic EF of > 75%, NWMA, grade 1 diastolic dysfunction. -Patient remained asymptomatic. -Patient started on ACE inhibitor. -Outpatient follow-up.   Procedures: CT head 04/21/2021 Chest x-ray 04/22/2021 MRI brain 04/22/2021 2D echo 04/26/2021    Consultations: Psychiatry: Dr. Bronwen Betters 04/23/2021  Discharge Exam: Vitals:   04/28/21 0409 04/28/21 0906  BP: (!) 135/91 (!) 165/77  Pulse: 76 72  Resp: 20 16  Temp: 98 F (36.7 C)   SpO2: 100% 99%    General: NAD Cardiovascular: RRR no murmurs rubs or gallops.  No JVD.  No lower extremity edema. Respiratory: Clear to auscultation bilaterally.  No wheezes, no crackles, no rhonchi.  Discharge Instructions   Discharge Instructions     Diet - low sodium heart healthy   Complete by: As directed    Increase activity slowly   Complete by: As directed       Allergies as of 04/28/2021   No Known Allergies      Medication List     STOP taking these medications    ibuprofen 200 MG tablet Commonly known as: ADVIL       TAKE these medications    acetaminophen 325 MG  tablet Commonly known as: TYLENOL Take 2 tablets (650 mg total) by mouth every 6 (six) hours as needed for mild pain (or Fever >/= 101).   aspirin 81 MG EC tablet Take 1 tablet (81 mg total) by mouth daily. Swallow whole. Start taking on: April 29, 2021   atorvastatin 10 MG tablet Commonly known as: LIPITOR Take 1 tablet (10 mg total) by mouth daily. Start taking on: April 29, 2021   gabapentin 100 MG capsule Commonly known as: NEURONTIN Take 1 capsule (100 mg total) by mouth 2 (two) times daily.   lisinopril 20 MG tablet Commonly known as: ZESTRIL Take 1 tablet (20 mg total) by mouth daily. Start taking on: April 29, 2021       No Known Allergies  Follow-up Information     PCP Follow up.                   The results of significant diagnostics from this hospitalization (including imaging, microbiology, ancillary and laboratory) are listed below for reference.    Significant Diagnostic Studies: CT HEAD WO CONTRAST ( )  Result Date: 04/21/2021 CLINICAL DATA:  Intermittent numbness and tingling in arms and legs, leg pain EXAM: CT HEAD WITHOUT CONTRAST TECHNIQUE: Contiguous axial images were obtained from the base of the skull through the vertex without intravenous contrast. COMPARISON:  None. FINDINGS: Brain: No evidence of acute infarction, hemorrhage, hydrocephalus, extra-axial collection or mass lesion/mass effect. Incidental cavum vergae variant of the lateral ventricles. Vascular: No hyperdense vessel or unexpected  calcification. Skull: Normal. Negative for fracture or focal lesion. Sinuses/Orbits: No acute finding. Other: None. IMPRESSION: No acute intracranial pathology. Electronically Signed   By: Lauralyn Primes M.D.   On: 04/21/2021 13:50   MR BRAIN WO CONTRAST  Result Date: 04/22/2021 CLINICAL DATA:  Weakness, unsteady gait EXAM: MRI HEAD WITHOUT CONTRAST TECHNIQUE: Multiplanar, multiecho pulse sequences of the brain and surrounding structures were  obtained without intravenous contrast. COMPARISON:  CT head dated 1 day prior FINDINGS: Brain: There is no evidence of acute intracranial hemorrhage, extra-axial fluid collection, or infarct. Scattered foci of FLAIR signal abnormality throughout the subcortical and periventricular white matter likely reflects sequela of chronic white matter microangiopathy. There is no suspicious parenchymal signal abnormality. The ventricles are not enlarged. There is no midline shift. There is a 3.9 cm AP by 2.8 cm TV by 2.2 cm cc T2 hypointense lesion centered around the right aspect of the clivus/sella likely reflecting a meningioma. There is encasement of the cavernous ICA and presumed cavernous sinus and sellar invasion. The lesion may extend to the foramen rotundum. The lesion extends to but does not definitely extend through the foramen ovale. There is mild mass effect on the pons without underlying parenchymal signal abnormality. Vascular: The right ICA flow void is mildly narrowed due to encasement by the above described lesion. The left ICA flow void is patent. Skull and upper cervical spine: Normal marrow signal. Sinuses/Orbits: The paranasal sinuses are clear. The globes and orbits are unremarkable. Other: None. IMPRESSION: 1. No acute intracranial pathology. 2. 3.9 cm solid lesion centered around the right aspect of the clivus/sella likely reflects a meningioma. There is encasement of the cavernous ICA. Recommend post-contrast imaging of the brain for better delineation of vascular involvement. This can be obtained on a nonemergent outpatient basis. Electronically Signed   By: Lesia Hausen M.D.   On: 04/22/2021 12:34   DG Chest Port 1 View  Result Date: 04/22/2021 CLINICAL DATA:  Cough, chest tightness EXAM: PORTABLE CHEST 1 VIEW COMPARISON:  08/05/2008 FINDINGS: The heart size and mediastinal contours are within normal limits. Both lungs are clear. The visualized skeletal structures are unremarkable. IMPRESSION: No  active disease. Electronically Signed   By: Charlett Nose M.D.   On: 04/22/2021 02:23   ECHOCARDIOGRAM COMPLETE  Result Date: 04/26/2021    ECHOCARDIOGRAM REPORT   Patient Name:   YASMENE SALOMONE Dishman Date of Exam: 04/26/2021 Medical Rec #:  [id number]              Height:       66.0 in Accession #:    [id number]             Weight:       170.0 lb Date of Birth:  [birth year]              BSA:          1.866 m Patient Age:    55 years               BP:           114/82 mmHg Patient Gender: F                      HR:           63 bpm. Exam Location:  Inpatient Procedure: 2D Echo, 3D Echo, Cardiac Doppler and Color Doppler Indications:    Abnormal ECG R94.31  History:        Patient has prior  history of Echocardiogram examinations, most                 recent 03/20/2019. COPD; Risk Factors:Current Smoker and                 Hypertension.  Sonographer:    Leta Jungling RDCS Referring Phys: [id number] Roxana G MATHEWS IMPRESSIONS  1. Left ventricular ejection fraction, by estimation, is >75%. The left ventricle has hyperdynamic function. The left ventricle has no regional wall motion abnormalities. There is severe concentric left ventricular hypertrophy. Left ventricular diastolic parameters are consistent with Grade I diastolic dysfunction (impaired relaxation).  2. Right ventricular systolic function is normal. The right ventricular size is normal. There is normal pulmonary artery systolic pressure. The estimated right ventricular systolic pressure is 31.2 mmHg.  3. The mitral valve is grossly normal. Mild mitral valve regurgitation.  4. The aortic valve was not well visualized. Aortic valve regurgitation is not visualized. No aortic stenosis is present.  5. The inferior vena cava is normal in size with <50% respiratory variability, suggesting right atrial pressure of 8 mmHg. Comparison(s): Prior images unable to be directly viewed, comparison made by report only. Changes from prior study are noted. 03/20/2019 [facility]): LVEF >55%. FINDINGS  Left Ventricle: Left ventricular ejection fraction, by estimation, is >75%. The left ventricle has hyperdynamic function. The left ventricle has no regional wall motion abnormalities. The left ventricular internal cavity size was small. There is severe concentric left ventricular hypertrophy. Left ventricular diastolic parameters are consistent with Grade I diastolic dysfunction (impaired relaxation). Indeterminate filling pressures. Right Ventricle: The right ventricular size is normal. No increase in right ventricular wall thickness. Right ventricular systolic function is normal. There is normal pulmonary artery systolic pressure. The tricuspid regurgitant velocity is 2.41 m/s, and  with an assumed right atrial pressure of 8 mmHg, the estimated right ventricular systolic pressure is 31.2 mmHg. Left Atrium: Left atrial size was normal in size. Right Atrium: Right atrial size was normal in size. Pericardium: There is no evidence of pericardial effusion. Mitral Valve: The mitral valve is grossly normal. Mild mitral valve regurgitation. Tricuspid Valve: The tricuspid valve is grossly normal. Tricuspid valve regurgitation is mild. Aortic Valve: The aortic valve was not well visualized. Aortic valve regurgitation is not visualized. No aortic stenosis is present. Pulmonic Valve: The pulmonic valve was normal in structure. Pulmonic valve regurgitation is not visualized. Aorta: The aortic root and ascending aorta are structurally normal, with no evidence of dilitation. Venous: The inferior vena cava is normal in size with less than 50% respiratory variability, suggesting right atrial pressure of 8 mmHg. IAS/Shunts: No atrial level shunt detected by color flow Doppler.  LEFT VENTRICLE PLAX 2D LVIDd:         4.10 cm  Diastology LVIDs:         2.80 cm  LV e' medial:    7.18 cm/s LV PW:         1.50 cm  LV E/e' medial:  10.9 LV IVS:        1.50 cm  LV e' lateral:   7.88 cm/s LVOT  diam:     2.00 cm  LV E/e' lateral: 9.9 LV SV:         80 LV SV Index:   43 LVOT Area:     3.14 cm  3D Volume EF:                         3D EF:        66 %                         LV EDV:       132 ml                         LV ESV:       45 ml                         LV SV:        86 ml RIGHT VENTRICLE RV S prime:     11.50 cm/s TAPSE (M-mode): 2.2 cm LEFT ATRIUM           Index       RIGHT ATRIUM           Index LA diam:      3.80 cm 2.04 cm/m  RA Area:     11.40 cm LA Vol (A4C): 29.1 ml 15.59 ml/m RA Volume:   25.20 ml  13.50 ml/m  AORTIC VALVE LVOT Vmax:   138.00 cm/s LVOT Vmean:  83.500 cm/s LVOT VTI:    0.256 m  AORTA Ao Root diam: 2.70 cm Ao Asc diam:  2.80 cm MITRAL VALVE               TRICUSPID VALVE MV Area (PHT): 5.06 cm    TR Peak grad:   23.2 mmHg MV Decel Time: 150 msec    TR Vmax:        241.00 cm/s MV E velocity: 78.00 cm/s MV A velocity: 74.60 cm/s  SHUNTS MV E/A ratio:  1.05        Systemic VTI:  0.26 m                            Systemic Diam: 2.00 cm Zoila Shutter MD Electronically signed by Zoila Shutter MD Signature Date/Time: 04/26/2021/3:43:15 PM    Final     Microbiology: Recent Results (from the past 240 hour(s))  Wet prep, genital     Status: Abnormal   Collection Time: 04/22/21 12:58 AM   Specimen: Urine, Clean Catch  Result Value Ref Range Status   Yeast Wet Prep HPF POC NONE SEEN NONE SEEN Final   Trich, Wet Prep NONE SEEN NONE SEEN Final   Clue Cells Wet Prep HPF POC PRESENT (A) NONE SEEN Final   WBC, Wet Prep HPF POC NONE SEEN NONE SEEN Final   Sperm NONE SEEN  Final    Comment: Performed at [facility], [address] [place], [place] [address]  Resp Panel by RT-PCR (Flu A&B, Covid) Nasopharyngeal Swab     Status: None   Collection Time: 04/22/21  5:28 AM   Specimen: Nasopharyngeal Swab; Nasopharyngeal(NP) swabs in vial transport medium  Result Value Ref Range Status   SARS Coronavirus 2 by RT PCR NEGATIVE NEGATIVE  Final    Comment: (NOTE) SARS-CoV-2 target nucleic acids are NOT DETECTED.  The SARS-CoV-2 RNA is generally detectable in upper respiratory specimens during the acute phase of infection. The lowest concentration of SARS-CoV-2 viral copies this assay can detect is 138 copies/mL. A negative result does not  preclude SARS-Cov-2 infection and should not be used as the sole basis for treatment or other patient management decisions. A negative result may occur with  improper specimen collection/handling, submission of specimen other than nasopharyngeal swab, presence of viral mutation(s) within the areas targeted by this assay, and inadequate number of viral copies(<138 copies/mL). A negative result must be combined with clinical observations, patient history, and epidemiological information. The expected result is Negative.  Fact Sheet for Patients:  BloggerCourse.com  Fact Sheet for Healthcare Providers:  SeriousBroker.it  This test is no t yet approved or cleared by the [place] FDA and  has been authorized for detection and/or diagnosis of SARS-CoV-2 by FDA under an Emergency Use Authorization (EUA). This EUA will remain  in effect (meaning this test can be used) for the duration of the COVID-19 declaration under Section 564(b)(1) of the Act, 21 U.S.C.section 360bbb-3(b)(1), unless the authorization is terminated  or revoked sooner.       Influenza A by PCR NEGATIVE NEGATIVE Final   Influenza B by PCR NEGATIVE NEGATIVE Final    Comment: (NOTE) The Xpert Xpress SARS-CoV-2/FLU/RSV plus assay is intended as an aid in the diagnosis of influenza from Nasopharyngeal swab specimens and should not be used as a sole basis for treatment. Nasal washings and aspirates are unacceptable for Xpert Xpress SARS-CoV-2/FLU/RSV testing.  Fact Sheet for Patients: BloggerCourse.com  Fact Sheet for Healthcare  Providers: SeriousBroker.it  This test is not yet approved or cleared by the [place] FDA and has been authorized for detection and/or diagnosis of SARS-CoV-2 by FDA under an Emergency Use Authorization (EUA). This EUA will remain in effect (meaning this test can be used) for the duration of the COVID-19 declaration under Section 564(b)(1) of the Act, 21 U.S.C. section 360bbb-3(b)(1), unless the authorization is terminated or revoked.  Performed at [facility], [address], [place], [place] [address]      Labs: Basic Metabolic Panel: Recent Labs  Lab 04/22/21 1030 04/23/21 0747 04/25/21 1245  NA 141 140 145  K 5.8* 3.7 3.7  CL 105 108 112*  CO2 24 27 27   GLUCOSE 85 103* 102*  BUN 16 17 15   CREATININE 1.02* 0.87 0.88  CALCIUM 9.0 8.4* 9.2  MG 2.4  --   --    Liver Function Tests: Recent Labs  Lab 04/22/21 1030 04/23/21 0747 04/25/21 1245  AST 87* 39 24  ALT 36 27 21  ALKPHOS 78 70 65  BILITOT 1.2 0.2* 0.3  PROT 7.3 6.3* 6.2*  ALBUMIN 3.8 3.5 3.4*   No results for input(s): LIPASE, AMYLASE in the last 168 hours. Recent Labs  Lab 04/22/21 1113  AMMONIA 34   CBC: Recent Labs  Lab 04/22/21 1030 04/23/21 0747 04/25/21 1245  WBC 6.4 5.7 6.0  NEUTROABS 3.4  --   --   HGB 14.2 12.9 12.4  HCT 43.0 38.9 37.7  MCV 92.3 91.5 92.2  PLT 287 312 325   Cardiac Enzymes: No results for input(s): CKTOTAL, CKMB, CKMBINDEX, TROPONINI in the last 168 hours. BNP: BNP (last 3 results) No results for input(s): BNP in the last 8760 hours.  ProBNP (last 3 results) No results for input(s): PROBNP in the last 8760 hours.  CBG: No results for input(s): GLUCAP in the last 168 hours.     Signed:  06/23/21 MD.  [facility] 04/28/2021, 2:32 PM

## 2021-04-28 NOTE — Progress Notes (Signed)
CSW updated that pt's negative COVID-19 test results are now available and was notified by Reino Kent, LCSW. Lorain Childes, RN advised that report will now be called to [facility].

## 2021-04-28 NOTE — TOC Progression Note (Signed)
Transition of Care [facility]) - Progression Note    Patient Details  Name: Carrie Adkins MRN: [id number] Date of Birth: [birth year]  [facility] CM/SW Contact  Darleene Cleaver, [place] Phone Number: 04/28/2021, 2:09 PM  Clinical Narrative:    CSW was informed that patient has a bed available at [facility] and they can accept her today pending negative Covid test.  CSW requested covid test, awaiting results, once results arrive patient will need transportation set up to go to [facility].        Expected Discharge Plan and Services                                                 Social Determinants of Health (SDOH) Interventions    Readmission Risk Interventions No flowsheet data found.

## 2021-04-28 NOTE — Progress Notes (Signed)
Called and spoke with Polo Riley at [facility] for report.

## 2021-04-28 NOTE — Progress Notes (Addendum)
Pt accepted to [facility]   Patient meets inpatient criteria per Caryn Bee, FNP   Dr.Archana Lucianne Muss is the attending provider.    Call report to [phone]    Dannial Monarch, RN @ [facility] CARDIAC FLOOR notified.     Pt scheduled  to arrive at [facility] today, if and when COVID results are negative. COVID results are currently pending...  Damita Dunnings, MSW, LCSW-A  1:01 PM 04/28/2021

## 2021-04-29 ENCOUNTER — Encounter (HOSPITAL_COMMUNITY): Payer: Self-pay | Admitting: Family

## 2021-04-29 ENCOUNTER — Other Ambulatory Visit: Payer: Self-pay

## 2021-04-29 DIAGNOSIS — F1420 Cocaine dependence, uncomplicated: Secondary | ICD-10-CM | POA: Diagnosis present

## 2021-04-29 DIAGNOSIS — F331 Major depressive disorder, recurrent, moderate: Secondary | ICD-10-CM | POA: Diagnosis present

## 2021-04-29 DIAGNOSIS — F332 Major depressive disorder, recurrent severe without psychotic features: Secondary | ICD-10-CM | POA: Insufficient documentation

## 2021-04-29 DIAGNOSIS — F419 Anxiety disorder, unspecified: Secondary | ICD-10-CM | POA: Diagnosis present

## 2021-04-29 DIAGNOSIS — F1994 Other psychoactive substance use, unspecified with psychoactive substance-induced mood disorder: Secondary | ICD-10-CM

## 2021-04-29 LAB — HCG, SERUM, QUALITATIVE: Preg, Serum: NEGATIVE

## 2021-04-29 LAB — TSH: TSH: 2.559 u[IU]/mL (ref 0.350–4.500)

## 2021-04-29 MED ORDER — DULOXETINE HCL 30 MG PO CPEP
30.0000 mg | ORAL_CAPSULE | Freq: Every day | ORAL | Status: DC
  Administered 2021-04-29 – 2021-05-03 (×5): 30 mg via ORAL
  Filled 2021-04-29 (×8): qty 1

## 2021-04-29 MED ORDER — ACETAMINOPHEN 325 MG PO TABS
650.0000 mg | ORAL_TABLET | Freq: Four times a day (QID) | ORAL | Status: DC | PRN
  Administered 2021-04-29 – 2021-05-03 (×11): 650 mg via ORAL
  Filled 2021-04-29 (×10): qty 2

## 2021-04-29 MED ORDER — ALBUTEROL SULFATE HFA 108 (90 BASE) MCG/ACT IN AERS: 2.0000 | INHALATION_SPRAY | Freq: Four times a day (QID) | RESPIRATORY_TRACT | Status: DC | PRN

## 2021-04-29 MED ORDER — LISINOPRIL 10 MG PO TABS
10.0000 mg | ORAL_TABLET | Freq: Every day | ORAL | Status: DC
  Administered 2021-04-29 – 2021-05-02 (×4): 10 mg via ORAL
  Filled 2021-04-29 (×6): qty 1

## 2021-04-29 MED ORDER — TRAZODONE HCL 50 MG PO TABS
50.0000 mg | ORAL_TABLET | Freq: Every evening | ORAL | Status: DC | PRN
  Administered 2021-05-01 – 2021-05-03 (×3): 50 mg via ORAL
  Filled 2021-04-29 (×4): qty 1

## 2021-04-29 MED ORDER — GABAPENTIN 100 MG PO CAPS
100.0000 mg | ORAL_CAPSULE | Freq: Two times a day (BID) | ORAL | Status: DC
  Administered 2021-04-29 – 2021-04-30 (×4): 100 mg via ORAL
  Filled 2021-04-29 (×8): qty 1

## 2021-04-29 MED ORDER — BUSPIRONE HCL 5 MG PO TABS
5.0000 mg | ORAL_TABLET | Freq: Two times a day (BID) | ORAL | Status: DC
  Administered 2021-04-29 – 2021-05-04 (×10): 5 mg via ORAL
  Filled 2021-04-29 (×13): qty 1

## 2021-04-29 MED ORDER — ATORVASTATIN CALCIUM 10 MG PO TABS
10.0000 mg | ORAL_TABLET | Freq: Every day | ORAL | Status: DC
  Administered 2021-04-29 – 2021-05-04 (×6): 10 mg via ORAL
  Filled 2021-04-29 (×7): qty 1

## 2021-04-29 MED ORDER — ALUM & MAG HYDROXIDE-SIMETH 200-200-20 MG/5ML PO SUSP: 30.0000 mL | ORAL | Status: DC | PRN

## 2021-04-29 MED ORDER — MAGNESIUM HYDROXIDE 400 MG/5ML PO SUSP: 30.0000 mL | Freq: Every day | ORAL | Status: DC | PRN

## 2021-04-29 MED ORDER — METRONIDAZOLE 500 MG PO TABS
500.0000 mg | ORAL_TABLET | Freq: Two times a day (BID) | ORAL | Status: AC
  Filled 2021-04-29: qty 1

## 2021-04-29 MED ORDER — HYDROXYZINE HCL 25 MG PO TABS
25.0000 mg | ORAL_TABLET | Freq: Three times a day (TID) | ORAL | Status: DC | PRN
  Administered 2021-05-01 – 2021-05-04 (×8): 25 mg via ORAL
  Filled 2021-04-29 (×8): qty 1

## 2021-04-29 NOTE — Plan of Care (Signed)
  Problem: Education: Goal: Emotional status will improve Outcome: Not Progressing Goal: Mental status will improve Outcome: Not Progressing Goal: Verbalization of understanding the information provided will improve Outcome: Not Progressing   

## 2021-04-29 NOTE — Progress Notes (Signed)
Psychoeducational Group Note  Date:  04/29/2021 Time:  0341  Group Topic/Focus:  Wrap-Up Group:   The focus of this group is to help patients review their daily goal of treatment and discuss progress on daily workbooks.  Participation Level: Did Not Attend  Participation Quality:  Not Applicable  Affect:  Not Applicable  Cognitive:  Not Applicable  Insight:  Not Applicable  Engagement in Group: Not Applicable  Additional Comments:  Pt.was admitted to the hallway after the group concluded.   Hazle Coca S 04/29/2021, 3:41 AM
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
## 2021-04-29 NOTE — Progress Notes (Signed)
Carrie Adkins is a 55 y.o. female voluntarily admitted for  suicidal ideation without plan. Pt has a history polysubstance abuse. Pt stated she came to [place] to meet some friends after which she started using drugs, reported having unprotected sex with new vaginal discharge. Pt currently live in her car, but plans to move to [place] after discharge to live with the son. Pt has been cooperative with admission process, alert and oriented x 4. Denies SI/HI/AVH and contracted for safety. Skin search reveal scars on both hips and left knee due to hip and knee replacement. Pt placed on high fall risk due to unsteady gait. Consents signed, belongings search completed and pt oriented to unit. Pt stable at this time. Pt given the opportunity to express concerns and ask questions. Pt given toiletries. Will continue to monitor.

## 2021-04-29 NOTE — [facility] Group Notes (Signed)
Patient attended group and contributed

## 2021-04-29 NOTE — Progress Notes (Signed)
     04/29/21 2041  Vital Signs  Temp 97.9 F (36.6 C)  Temp Source Oral  Pulse Rate 77  BP 140/87  BP Location Left Arm  BP Method Automatic  Patient Position (if appropriate) Sitting     04/29/21 2042  Vital Signs  Pulse Rate 94  BP (!) 146/93  BP Location Left Arm  BP Method Automatic  Patient Position (if appropriate) Standing  Oxygen Therapy  SpO2 100 %  O2 Device Room Air    BP Slightly elevated on standing BP.  BP normal on sitting BP.  Pt has history of HTN.  Will continue to encourage frequent hydration.

## 2021-04-29 NOTE — [facility]HH Group Notes (Signed)
Pt attended and participated in recreation group in the courtyard 

## 2021-04-29 NOTE — Progress Notes (Signed)
Psychoeducational Group Note  Date:  04/29/2021 Time: 2245  Group Topic/Focus:  Wrap-Up Group:   The focus of this group is to help patients review their daily goal of treatment and discuss progress on daily workbooks.  Participation Level: Did Not Attend  Participation Quality:  Not Applicable  Affect:  Not Applicable  Cognitive:  Not Applicable  Insight:  Not Applicable  Engagement in Group: Not Applicable  Additional Comments:  The patient did not attend group this evening.   Hazle Coca S 04/29/2021, 10:46 PM

## 2021-04-29 NOTE — Progress Notes (Signed)
     04/29/21 2230  Psych Admission Type (Psych Patients Only)  Admission Status Voluntary  Psychosocial Assessment  Patient Complaints Insomnia  Eye Contact Fair  Facial Expression Anxious  Affect Depressed;Anxious  Speech Logical/coherent  Interaction Assertive  Motor Activity Slow  Appearance/Hygiene In scrubs  Behavior Characteristics Cooperative;Calm  Mood Anxious  Thought Process  Coherency WDL  Content WDL  Delusions None reported or observed  Perception WDL  Hallucination None reported or observed  Judgment WDL  Confusion None  Danger to Self  Current suicidal ideation? Denies  Danger to Others  Danger to Others None reported or observed

## 2021-04-29 NOTE — Progress Notes (Signed)
Pt has been alert and oriented to person, place, time and situation. Pt is calm, cooperative, pleasant and medication complaint, denies SI/HI/AVH. Will continue to monitor pt per Q15 minute face checks and monitor for safety and progress.
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
## 2021-04-30 ENCOUNTER — Encounter (HOSPITAL_COMMUNITY): Payer: Self-pay

## 2021-04-30 LAB — GC/CHLAMYDIA PROBE AMP (~~LOC~~) NOT AT ARMC
Chlamydia: NEGATIVE
Comment: NEGATIVE
Comment: NORMAL
Neisseria Gonorrhea: NEGATIVE

## 2021-04-30 LAB — T4, FREE: Free T4: 0.67 ng/dL (ref 0.61–1.12)

## 2021-04-30 MED ORDER — GABAPENTIN 100 MG PO CAPS
200.0000 mg | ORAL_CAPSULE | Freq: Two times a day (BID) | ORAL | Status: DC
  Administered 2021-05-01 – 2021-05-03 (×5): 200 mg via ORAL
  Filled 2021-04-30 (×9): qty 2

## 2021-04-30 NOTE — [facility] Suicide Risk Assessment (Signed)
[facility] INPATIENT:  Family/Significant Other Suicide Prevention Education  Suicide Prevention Education:  Education Completed; Allegra Grana [phone] (Sister) has been identified by the patient as the family member/significant other with whom the patient will be residing, and identified as the person(s) who will aid the patient in the event of a mental health crisis (suicidal ideations/suicide attempt).  With written consent from the patient, the family member/significant other has been provided the following suicide prevention education, prior to the and/or following the discharge of the patient.  The suicide prevention education provided includes the following: Suicide risk factors Suicide prevention and interventions National Suicide Hotline telephone number [facility] assessment telephone number [facility] Emergency Assistance 911 [facility] and/or Residential Mobile Crisis Unit telephone number  Request made of family/significant other to: Remove weapons (e.g., guns, rifles, knives), all items previously/currently identified as safety concern.   Remove drugs/medications (over-the-counter, prescriptions, illicit drugs), all items previously/currently identified as a safety concern.  The family member/significant other verbalizes understanding of the suicide prevention education information provided.  The family member/significant other agrees to remove the items of safety concern listed above.  CSW spoke with Mrs. Hemmingway who states that her sister has been using drugs and is homeless and has been staying in her car.  She states that her sister has multiple health issues and has been complaining of numbness in her face.  Mrs. Cordelia Pen states that her sister was living with her daughter at one point but had to leave there because her daughter is also using substances.  Mrs. Cordelia Pen states that there are no family members at this time that are willing to take  her sister into their home and that a shelter or residential treatment will be her only options.  She states that her family will continue to help her look for shelters and housing by making calls or sending her money but there is no one that will allow her to live with them.  Mrs. Cordelia Pen states that her sister does not have any known firearms or weapons in her possession.  Mrs. Cordelia Pen would like her sister to attend a 30-day residential treatment facility.  CSW completed SPE with Mrs. Hemmingway.   Aram Beecham 04/30/2021, 3:32 PM

## 2021-04-30 NOTE — [facility] Counselor (Signed)
CSW spoke with the Pt who states that she cannot go to [place] now because her son has his wife's family staying with him and "there is not room for me".  The Pt states that she would like her information sent to [facility] because her daughter in [place] told her that she could come back to her home if she completed a 30-day treatment program.  CSW will send the Pt's information to [facility] today.  CSW informed the Pt that it would most likely be Monday or Tuesday of next week before CSW has an answer back from [facility] about admission.

## 2021-04-30 NOTE — Progress Notes (Signed)
   04/30/21 1200  Psych Admission Type (Psych Patients Only)  Admission Status Voluntary  Psychosocial Assessment  Patient Complaints Anxiety  Eye Contact Fair  Facial Expression Anxious  Affect Depressed;Anxious  Speech Logical/coherent  Interaction Assertive  Motor Activity Slow  Appearance/Hygiene In scrubs  Behavior Characteristics Cooperative  Mood Anxious  Thought Process  Coherency WDL  Content WDL  Delusions None reported or observed  Perception WDL  Hallucination None reported or observed  Judgment WDL  Confusion None  Danger to Self  Current suicidal ideation? Denies  Danger to Others  Danger to Others None reported or observed

## 2021-04-30 NOTE — [facility] IP Treatment Plan (Signed)
Interdisciplinary Treatment and Diagnostic Plan Update  04/30/2021 Time of Session: 9:30am  Carrie Adkins MRN: [id number]  Principal Diagnosis: Major depressive disorder, recurrent episode, moderate ([place])  Secondary Diagnoses: Principal Problem:   Major depressive disorder, recurrent episode, moderate (HCC) Active Problems:   Polysubstance abuse ([place])   Anxiety disorder, unspecified   Substance induced mood disorder (HCC)   Moderate cocaine use disorder (HCC)   Current Medications:  Current Facility-Administered Medications  Medication Dose Route Frequency Provider Last Rate Last Admin   acetaminophen (TYLENOL) tablet 650 mg  650 mg Oral Q6H PRN Suella Broad, FNP   650 mg at 04/30/21 0828   albuterol (VENTOLIN HFA) 108 (90 Base) MCG/ACT inhaler 2 puff  2 puff Inhalation Q6H PRN Arthor Captain, MD       alum & mag hydroxide-simeth (MAALOX/MYLANTA) 200-200-20 MG/5ML suspension 30 mL  30 mL Oral Q4H PRN Starkes-Perry, Gayland Curry, FNP       atorvastatin (LIPITOR) tablet 10 mg  10 mg Oral Daily Suella Broad, FNP   10 mg at 04/30/21 0826   busPIRone (BUSPAR) tablet 5 mg  5 mg Oral BID Arthor Captain, MD   5 mg at 04/30/21 0827   DULoxetine (CYMBALTA) DR capsule 30 mg  30 mg Oral Daily Arthor Captain, MD   30 mg at 04/30/21 9735   gabapentin (NEURONTIN) capsule 100 mg  100 mg Oral BID Suella Broad, FNP   100 mg at 04/30/21 3299   hydrOXYzine (ATARAX/VISTARIL) tablet 25 mg  25 mg Oral TID PRN Arthor Captain, MD       lisinopril (ZESTRIL) tablet 10 mg  10 mg Oral Daily Suella Broad, FNP   10 mg at 04/30/21 0827   magnesium hydroxide (MILK OF MAGNESIA) suspension 30 mL  30 mL Oral Daily PRN Suella Broad, FNP       traZODone (DESYREL) tablet 50 mg  50 mg Oral QHS PRN Arthor Captain, MD       PTA Medications: Medications Prior to Admission  Medication Sig Dispense Refill Last Dose   acetaminophen (TYLENOL) 325 MG tablet Take 2 tablets  (650 mg total) by mouth every 6 (six) hours as needed for mild pain (or Fever >/= 101).      aspirin EC 81 MG EC tablet Take 1 tablet (81 mg total) by mouth daily. Swallow whole. 30 tablet 11    atorvastatin (LIPITOR) 10 MG tablet Take 1 tablet (10 mg total) by mouth daily. 30 tablet 1    gabapentin (NEURONTIN) 100 MG capsule Take 1 capsule (100 mg total) by mouth 2 (two) times daily.      lisinopril (ZESTRIL) 20 MG tablet Take 1 tablet (20 mg total) by mouth daily. 30 tablet 1     Patient Stressors:    Patient Strengths:    Treatment Modalities: Medication Management, Group therapy, Case management,  1 to 1 session with clinician, Psychoeducation, Recreational therapy.   Physician Treatment Plan for Primary Diagnosis: Major depressive disorder, recurrent episode, moderate (HCC) Long Term Goal(s): Improvement in symptoms so as ready for discharge   Short Term Goals: Ability to identify changes in lifestyle to reduce recurrence of condition will improve Ability to verbalize feelings will improve Ability to disclose and discuss suicidal ideas Ability to demonstrate self-control will improve Ability to identify and develop effective coping behaviors will improve Ability to maintain clinical measurements within normal limits will improve Compliance with prescribed medications will improve Ability to identify triggers  associated with substance abuse/mental health issues will improve  Medication Management: Evaluate patient's response, side effects, and tolerance of medication regimen.  Therapeutic Interventions: 1 to 1 sessions, Unit Group sessions and Medication administration.  Evaluation of Outcomes: Not Met  Physician Treatment Plan for Secondary Diagnosis: Principal Problem:   Major depressive disorder, recurrent episode, moderate (HCC) Active Problems:   Polysubstance abuse (HCC)   Anxiety disorder, unspecified   Substance induced mood disorder (HCC)   Moderate cocaine use  disorder (HCC)  Long Term Goal(s): Improvement in symptoms so as ready for discharge   Short Term Goals: Ability to identify changes in lifestyle to reduce recurrence of condition will improve Ability to verbalize feelings will improve Ability to disclose and discuss suicidal ideas Ability to demonstrate self-control will improve Ability to identify and develop effective coping behaviors will improve Ability to maintain clinical measurements within normal limits will improve Compliance with prescribed medications will improve Ability to identify triggers associated with substance abuse/mental health issues will improve     Medication Management: Evaluate patient's response, side effects, and tolerance of medication regimen.  Therapeutic Interventions: 1 to 1 sessions, Unit Group sessions and Medication administration.  Evaluation of Outcomes: Not Met   RN Treatment Plan for Primary Diagnosis: Major depressive disorder, recurrent episode, moderate (HCC) Long Term Goal(s): Knowledge of disease and therapeutic regimen to maintain health will improve  Short Term Goals: Ability to remain free from injury will improve, Ability to participate in decision making will improve, Ability to verbalize feelings will improve, Ability to disclose and discuss suicidal ideas, and Ability to identify and develop effective coping behaviors will improve  Medication Management: RN will administer medications as ordered by provider, will assess and evaluate patient's response and provide education to patient for prescribed medication. RN will report any adverse and/or side effects to prescribing provider.  Therapeutic Interventions: 1 on 1 counseling sessions, Psychoeducation, Medication administration, Evaluate responses to treatment, Monitor vital signs and CBGs as ordered, Perform/monitor CIWA, COWS, AIMS and Fall Risk screenings as ordered, Perform wound care treatments as ordered.  Evaluation of Outcomes:  Not Met   LCSW Treatment Plan for Primary Diagnosis: Major depressive disorder, recurrent episode, moderate ([place]) Long Term Goal(s): Safe transition to appropriate next level of care at discharge, Engage patient in therapeutic group addressing interpersonal concerns.  Short Term Goals: Engage patient in aftercare planning with referrals and resources, Increase social support, Increase emotional regulation, Facilitate acceptance of mental health diagnosis and concerns, Facilitate patient progression through stages of change regarding substance use diagnoses and concerns, Identify triggers associated with mental health/substance abuse issues, and Increase skills for wellness and recovery  Therapeutic Interventions: Assess for all discharge needs, 1 to 1 time with Social worker, Explore available resources and support systems, Assess for adequacy in community support network, Educate family and significant other(s) on suicide prevention, Complete Psychosocial Assessment, Interpersonal group therapy.  Evaluation of Outcomes: Not Met   Progress in Treatment: Attending groups: Yes. Participating in groups: Yes. Taking medication as prescribed: Yes. Toleration medication: Yes. Family/Significant other contact made: Yes, individual(s) contacted:  Sister  Patient understands diagnosis: Yes. Discussing patient identified problems/goals with staff: Yes. Medical problems stabilized or resolved: Yes. Denies suicidal/homicidal ideation: Yes. Issues/concerns per patient self-inventory: No.   New problem(s) identified: No, Describe:  None   New Short Term/Long Term Goal(s): medication stabilization, elimination of SI thoughts, development of comprehensive mental wellness plan.   Patient Goals:  Did not attend   Discharge Plan or Barriers: Patient recently  admitted. CSW will continue to follow and assess for appropriate referrals and possible discharge planning.   Reason for Continuation of  Hospitalization: Depression Medication stabilization Suicidal ideation Withdrawal symptoms  Estimated Length of Stay: 3 to 5 days    Scribe for Treatment Team: Darleen Crocker, Latanya Presser 04/30/2021 2:24 PM

## 2021-04-30 NOTE — Progress Notes (Signed)
[facility] MD Progress Note  04/30/2021 5:22 PM Carrie Adkins  MRN:  [id number]  Reason for admission:  Carrie Adkins. [place] is a 55 year old female with a history of polysubstance abuse (cocaine, marijuana, methamphetamine), depression and anxiety as well as multiple medical problems (COPD, hypertension, hyperlipidemia, diastolic CHF, history of MI, chronic pain, arthritis) who was admitted to [facility] from the inpatient medical unit at [facility] for further evaluation and treatment of depression and suicidal ideation.  The patient initially presented to the emergency department at [facility] on 04/21/2021 reporting multiple physical complaints including diffuse tingling, unsteady gait and vaginal discharge in the setting of recent relapse on crack cocaine.  Patient also made statements about not wanting to be a burden on others and planning to ingest pills.  In the ED, head CT was unremarkable, BAL was undetectable and urine tox screen was positive for amphetamines, cocaine and THC.  Objective: Medical record reviewed.  Patient's case discussed in detail with members of the treatment team and nursing staff.  I met with and evaluated the patient on the unit today for follow-up.  Patient appears improved from yesterday.  She presents with better mood and stable affect.  Patient spontaneously reports that she feels calmer and less anxious.  She denies sad or depressed mood.  Patient denies anhedonia, passive wish for death, SI, AI, HI, PI, AH or VH.  She says she has been enjoying some of the groups on the unit.  Patient states that she has tolerated duloxetine and buspirone which were recently restarted without any difficulty.  She denies any side effects to her medication.  Patient reports chronic right-sided facial numbness which she states has been present for months.  She was advised to follow-up with her PCP and a neurologist as an outpatient after discharge from her inpatient medical stay  prior to transfer to [facility].  Patient also continues to experience chronic bilateral hip pain and chronic bilateral knee pain secondary to arthritis.  She denies chest pain, palpitations, shortness of breath, cough, myalgias, dizziness, orthostasis, ataxia, nausea, vomiting, constipation, diarrhea or other physical issues.  We discussed her plans to participate in substance abuse treatment after eventual discharge from [facility].  Patient says that her son has other houseguests and she no longer plans to go to [place] to stay with him.  Patient states plan to remain in [place] and has been talking about substance abuse treatment program in [place] with the Education officer, museum.  She says she plans to return to [facility] after she completes the substance abuse treatment program in [place].  She says that she is eating and sleeping adequately.  Staff document that patient slept 6.75 hours last night.  GC and Chlamydia tests were negative.  TSH and free T4 were WNL.  Serum pregnancy test was negative.  Vital signs this morning include BP 135/86, pulse 68, respirations 16 and temperature 98.7.  Patient is taking scheduled medications as prescribed.  She has taken PRN acetaminophen twice per day but has not taken any other PRN medications.  Patient has been visible in the milieu watching TV with peers.  Staff document that she has been attending and participating in some groups.  Principal Problem: Major depressive disorder, recurrent episode, moderate (HCC) Diagnosis: Principal Problem:   Major depressive disorder, recurrent episode, moderate (HCC) Active Problems:   Polysubstance abuse (HCC)   Anxiety disorder, unspecified   Substance induced mood disorder (HCC)   Moderate cocaine use disorder ([place])  Total Time spent with patient:  25 minutes  Past Psychiatric History: See admission H&P  Past Medical History:  Past Medical History:  Diagnosis Date   COPD without exacerbation ([place]) 03/01/2007    Qualifier: Diagnosis of  By: Marshell Levan MD, Curtis     Coronary artery disease involving native coronary artery of native heart 04/22/2021   Nicotine dependence, cigarettes, uncomplicated 01/18/2946   Polysubstance abuse ([place]) 04/22/2021   History reviewed. No pertinent surgical history. Family History:  Family History  Problem Relation Age of Onset   Heart disease Neg Hx    Family Psychiatric  History: See admission H&P Social History:  Social History   Substance and Sexual Activity  Alcohol Use Not Currently     Social History   Substance and Sexual Activity  Drug Use Yes   Types: Cocaine, Methamphetamines, Marijuana    Social History   Socioeconomic History   Marital status: Legally Separated    Spouse name: Not on file   Number of children: Not on file   Years of education: Not on file   Highest education level: Not on file  Occupational History   Not on file  Tobacco Use   Smoking status: Every Day    Types: Cigarettes   Smokeless tobacco: Never  Vaping Use   Vaping Use: Never used  Substance and Sexual Activity   Alcohol use: Not Currently   Drug use: Yes    Types: Cocaine, Methamphetamines, Marijuana   Sexual activity: Yes    Birth control/protection: None  Other Topics Concern   Not on file  Social History Narrative   Not on file   Social Determinants of Health   Financial Resource Strain: Not on file  Food Insecurity: Not on file  Transportation Needs: Not on file  Physical Activity: Not on file  Stress: Not on file  Social Connections: Not on file   Additional Social History:                         Sleep: Good  Appetite:  Good  Current Medications: Current Facility-Administered Medications  Medication Dose Route Frequency Provider Last Rate Last Admin   acetaminophen (TYLENOL) tablet 650 mg  650 mg Oral Q6H PRN Suella Broad, FNP   650 mg at 04/30/21 1544   albuterol (VENTOLIN HFA) 108 (90 Base) MCG/ACT inhaler 2 puff  2  puff Inhalation Q6H PRN Arthor Captain, MD       alum & mag hydroxide-simeth (MAALOX/MYLANTA) 200-200-20 MG/5ML suspension 30 mL  30 mL Oral Q4H PRN Starkes-Perry, Gayland Curry, FNP       atorvastatin (LIPITOR) tablet 10 mg  10 mg Oral Daily Suella Broad, FNP   10 mg at 04/30/21 0826   busPIRone (BUSPAR) tablet 5 mg  5 mg Oral BID Arthor Captain, MD   5 mg at 04/30/21 1648   DULoxetine (CYMBALTA) DR capsule 30 mg  30 mg Oral Daily Arthor Captain, MD   30 mg at 04/30/21 0826   gabapentin (NEURONTIN) capsule 100 mg  100 mg Oral BID Suella Broad, FNP   100 mg at 04/30/21 1648   hydrOXYzine (ATARAX/VISTARIL) tablet 25 mg  25 mg Oral TID PRN Arthor Captain, MD       lisinopril (ZESTRIL) tablet 10 mg  10 mg Oral Daily Suella Broad, FNP   10 mg at 04/30/21 0827   magnesium hydroxide (MILK OF MAGNESIA) suspension 30 mL  30  mL Oral Daily PRN Suella Broad, FNP       traZODone (DESYREL) tablet 50 mg  50 mg Oral QHS PRN Arthor Captain, MD        Lab Results:  Results for orders placed or performed during the hospital encounter of 04/28/21 (from the past 48 hour(s))  GC/Chlamydia probe amp ([place])not at [facility]     Status: None   Collection Time: 04/29/21 12:11 AM  Result Value Ref Range   Neisseria Gonorrhea Negative    Chlamydia Negative    Comment Normal Reference Ranger Chlamydia - Negative    Comment      Normal Reference Range Neisseria Gonorrhea - Negative  TSH     Status: None   Collection Time: 04/29/21  6:17 PM  Result Value Ref Range   TSH 2.559 0.350 - 4.500 uIU/mL    Comment: Performed by a 3rd Generation assay with a functional sensitivity of <=0.01 uIU/mL. Performed at [facility], [place] [address] [place], [place] [address]   T4, free     Status: None   Collection Time: 04/29/21  6:17 PM  Result Value Ref Range   Free T4 0.67 0.61 - 1.12 ng/dL    Comment: (NOTE) Biotin ingestion may interfere with free T4 tests. If the  results are inconsistent with the TSH level, previous test results, or the clinical presentation, then consider biotin interference. If needed, order repeat testing after stopping biotin. Performed at [facility], [place] [address], [place], [place] [address]   hCG, serum, qualitative     Status: None   Collection Time: 04/29/21  6:17 PM  Result Value Ref Range   Preg, Serum NEGATIVE NEGATIVE    Comment: Performed at [facility], [place] [address], [place], [place] [address]    Blood Alcohol level:  Lab Results  Component Value Date   ETH <10 83/33/8329    Metabolic Disorder Labs: Lab Results  Component Value Date   HGBA1C 5.4 04/22/2021   MPG 108.28 04/22/2021   No results found for: PROLACTIN Lab Results  Component Value Date   CHOL 194 04/22/2021   TRIG 106 04/22/2021   HDL 48 04/22/2021   CHOLHDL 4.0 04/22/2021   VLDL 21 04/22/2021   LDLCALC 125 (H) 04/22/2021   LDLCALC 154 (H) 03/13/2009    Physical Findings: AIMS:  , ,  ,  ,    CIWA:    COWS:     Musculoskeletal: Strength & Muscle Tone: within normal limits Gait & Station:  Slow steady gait due to discomfort from arthritis Patient leans: N/A  Psychiatric Specialty Exam:  Presentation  General Appearance: Appropriate for Environment; Fairly Groomed  Eye Contact:Good  Speech:Clear and Coherent; Normal Rate  Speech Volume:Normal  Handedness:Right   Mood and Affect  Mood:Anxious ("Better.  Less anxious")  Affect:Appropriate; Congruent   Thought Process  Thought Processes:Coherent; Goal Directed  Descriptions of Associations:Intact  Orientation:Full (Time, Place and Person)  Thought Content:Logical; WDL  History of Schizophrenia/Schizoaffective disorder:No data recorded Duration of Psychotic Symptoms:No data recorded Hallucinations:Hallucinations: None  Ideas of Reference:None  Suicidal Thoughts:Suicidal Thoughts: No  Homicidal Thoughts:Homicidal Thoughts:  No   Sensorium  Memory:Immediate Good; Recent Good; Remote Good  Judgment:Fair  Insight:Fair   Executive Functions  Concentration:Good  Attention Span:Good  [place] of Knowledge:Good  Language:Good   Psychomotor Activity  Psychomotor Activity:Psychomotor Activity: Normal   Assets  Assets:Communication Skills; Desire for Improvement; Financial Resources/Insurance; Housing; Resilience; Social Support  Sleep  Sleep:Sleep: Fair Number of Hours of Sleep: 6.75    Physical Exam: Physical Exam Vitals and nursing note reviewed.  Constitutional:      General: She is not in acute distress.    Appearance: Normal appearance. She is not diaphoretic.  HENT:     Head: Normocephalic and atraumatic.  Cardiovascular:     Rate and Rhythm: Normal rate.  Pulmonary:     Effort: Pulmonary effort is normal.  Neurological:     General: No focal deficit present.     Mental Status: She is alert and oriented to person, place, and time.   Review of Systems  Constitutional:  Negative for chills, diaphoresis and fever.  HENT:  Negative for sore throat.   Respiratory:  Negative for cough and shortness of breath.   Cardiovascular:  Negative for chest pain and palpitations.  Gastrointestinal:  Negative for constipation, diarrhea, nausea and vomiting.  Genitourinary: Negative.   Musculoskeletal:  Positive for joint pain. Negative for myalgias.       Positive for chronic bilateral hip pain and chronic bilateral knee pain  Neurological:  Negative for dizziness, tremors and seizures.       Positive for chronic unilateral numbness of right side of face  Psychiatric/Behavioral:  Positive for substance abuse. Negative for depression, hallucinations and suicidal ideas. The patient is nervous/anxious. The patient does not have insomnia.   All other systems reviewed and are negative. Blood pressure 135/86, pulse 68, temperature 98.7 F (37.1 C), temperature source Oral, resp. rate  16, height 5' 6"  (1.676 m), weight 80.7 kg, SpO2 100 %. Body mass index is 28.73 kg/m.   Treatment Plan Summary: The patient is a 55 year old female with multiple medical problems and prior history of depression, anxiety and substance abuse who was admitted after she reported worsening symptoms of depression and suicidal ideation in the context of recent relapse on cocaine.  Patient is tolerating initiation of duloxetine and buspirone which she has taken in the past.  She is denying depressed mood or suicidal ideation.  Patient reports desire to go to residential substance abuse treatment program after discharge and social work is investigating residential substance abuse treatment options for patient.  Daily contact with patient to assess and evaluate symptoms and progress in treatment and Medication management  Major depressive disorder, recurrent, without psychotic features -Continue duloxetine 30 mg daily for mood and anxiety.  Anticipate future upward titration. -Continue buspirone 5 mg twice daily for adjunctive treatment of mood and anxiety.  Anxiety disorder, unspecified -Continue duloxetine 30 mg daily for mood and anxiety.  Anticipate future upward titration. -Continue buspirone 5 mg twice daily for adjunctive treatment of mood and anxiety. -Increase gabapentin to 200 mg twice daily for chronic pain and anxiety -Continue hydroxyzine 25 mg 3 times daily PRN  Insomnia -Continue trazodone 50 mg nightly PRN  Substance use disorder (cocaine) -I have discussed with patient the risks of substance use and advised cessation of alcohol and drug use. -I have advised patient to participate in substance abuse treatment program after discharge.  Patient is receptive to participating in residential substance abuse treatment program.  Social work has been investigating options for patient.  Abnormal MRI of the brain -MRI of the brain showed no acute intracranial pathology.  There was a 3.9 cm  solid lesion centered around the right aspect of the clivus/sella that likely reflects a meningioma.  MRI also showed encasement of the cavernous ICA.  Repeat postcontrast imaging of the brain for better delineation  of vascular involvement on a nonemergent outpatient basis was recommended.   Chronic facial numbness -Patient to follow-up as outpatient with PCP and neurology  Hypertension -Continue lisinopril 10 mg daily  Elevated cholesterol -Continue Lipitor 10 mg daily  Chronic pain -Increase gabapentin to 200 mg twice daily for chronic pain and anxiety -Continue acetaminophen 650 mg Q6H PRN  COPD -Continue albuterol inhaler Q6H PRN  Discharge planning in progress.   Patient will need to follow-up with her PCP regarding multiple medical conditions.  She will need outpatient mental health therapy and psychiatric med management follow-up.  Social work is investigating possible substance abuse treatment programs for patient.     Arthor Captain, MD 04/30/2021, 5:22 PM

## 2021-04-30 NOTE — BHH Group Notes (Signed)
Adult Psychoeducational Group Note  Date:  04/30/2021 Time:  11:40 PM  Group Topic/Focus:  Managing Feelings:   The focus of this group is to identify what feelings patients have difficulty handling and develop a plan to handle them in a healthier way upon discharge.  Participation Level:  Minimal  Participation Quality:  Appropriate  Affect:  Appropriate  Cognitive:  Alert and Appropriate  Insight: Good  Engagement in Group:  Engaged  Modes of Intervention:  Discussion  Additional Comments  Jacalyn Lefevre 04/30/2021, 11:40 PM

## 2021-04-30 NOTE — Group Note (Deleted)
LCSW Group Therapy Note   Group Date: 04/30/2021 Start Time: 1300 End Time: 1400   Type of Therapy and Topic:  Group Therapy:   Participation Level:  {BHH PARTICIPATION LEVEL:22264}  Description of Group:   Therapeutic Goals:  1.     Summary of Patient Progress:    ***  Therapeutic Modalities:   Rayvion Stumph A Cobe Viney, LCSWA 04/30/2021  2:06 PM    

## 2021-04-30 NOTE — BHH Counselor (Signed)
CSW spoke with Carrie Adkins at [facility] and the Pt was denied due to her Medicaid being out of [facility].

## 2021-04-30 NOTE — Group Note (Signed)
Recreation Therapy Group Note   Group Topic:Communication  Group Date: 04/30/2021 Start Time: 1000 End Time: 1035 Facilitators: Caroll Rancher, LRT/CTRS Location: [address] Dayroom  Goal Area(s) Addresses:  Patient will effectively listen to complete activity.  Patient will identify communication skills used to make activity successful.  Patient will identify how skills used during activity can be used to reach post d/c goals.    Group Description:  Geometric Drawings.  Three volunteers from the peer group will be shown an abstract picture with a particular arrangement of geometrical shapes.  Each round, one 'speaker' will describe the pattern, as accurately as possible without revealing the image to the group.  The remaining group members will listen and draw the picture to reflect how it is described to them. Patients with the role of 'listener' cannot ask clarifying questions but, may request that the speaker repeat a direction. Once the drawings are complete, the presenter will show the rest of the group the picture and compare how close each person came to drawing the picture. LRT will facilitate a post-activity discussion regarding effective communication and the importance of planning, listening, and asking for clarification in daily interactions with others.   Affect/Mood: Appropriate   Participation Level: Engaged   Participation Quality: Independent   Behavior: Appropriate   Speech/Thought Process: Focused   Insight: Good   Judgement: Good   Modes of Intervention: Activity and Guided Discussion   Patient Response to Interventions:  Attentive and Engaged   Education Outcome:  Acknowledges education and In group clarification offered    Clinical Observations/Individualized Feedback: Pt was engaged and vocal throughout group.  Pt was the second presenter and felt she did the best she could describing the picture to peers.  Pt peers felt she did a good job as well.  Pt  explained one of the ways to make sure communication is effective is to ask questions of the person you're speaking to because "you can hear but not listen".  Pt also explained your environment can impact communication based on the amount of noise.  Overall, pt felt she did a good job with presenting in group.     Plan: Continue to engage patient in RT group sessions 2-3x/week.   Caroll Rancher, LRT/CTRS 04/30/2021 12:51 PM

## 2021-04-30 NOTE — BHH Counselor (Addendum)
CSW faxed off referrals to [facility], [facility], and [facility].  CSW will provide the Pt with phone numbers for these facilities so they can follow up on available beds.

## 2021-05-01 NOTE — Progress Notes (Addendum)
[facility] MD Progress Note  05/02/2021 5:04 PM Carrie Adkins  MRN:  [id number]  Subjective: "I feel so much better today. I feel like I slept really well."   Objective:  Carrie Adkins is a 55 year old female with a history of polysubstance abuse (cocaine, marijuana, methamphetamine), depression and anxiety as well as multiple medical problems (COPD, hypertension, hyperlipidemia, diastolic CHF, history of MI, chronic pain, arthritis) who was admitted to [facility] from the inpatient medical unit at [facility] for further evaluation and treatment of depression and suicidal ideation.  The patient initially presented to the emergency department at [facility] on 04/21/2021 reporting multiple physical complaints including diffuse tingling, unsteady gait and vaginal discharge in the setting of recent relapse on crack cocaine.  Patient also made statements about not wanting to be a burden on others and planning to ingest pills.  In the ED, head CT was unremarkable, BAL was undetectable and urine tox screen was positive for amphetamines, cocaine and THC.  Evaluation on the unit today, 9/18: Patient was seen and evaluated, chart reviewed and case discussed with the treatment team.  Patient stated she feels she slept well last night, record reflects she slept 6.5 hours. Patient reported a good appetite. She presents with better mood and stable affect. Patient reported depression of 1/10 and anxiety of 5/10 with 10 being the worst. She stated she is taking her medications and has no issues with them. She reported she stopped taking her medications, duloxetine and buspirone a while ago because she thought she was okay. She has been self-medicating with "crack" cocaine.  Her UDS was positive for amphetamines, cocaine and THC on admission. She denies drug cravings. Patient denies  SI/HI, AH or VH, paranoia and delusions. She does not appear to be responding to internal stimuli. She is hopeful to go to  a residential treatment program and has been discussing this with the Child psychotherapist. Patient stated that her sister came to visit her yesterday and her other sister called her this morning. She stated she can not return to [facility] live with her daughter unless she goes to a treatment program. She denies any side effects to her medication. Patient complains of chronic bilateral hip pain and bilateral knee pain secondary to arthritis. She denies any other physical issues and is taking Tylenol for the pain. Vital signs this morning include BP sitting 153/95, pulse 66 and BP standing 149/100 and pulse 89. Her repeat BP at 1340 was 161/88. She is afebrile. Her home medications indicate her PTA Lisinopril dose was 20 mg daily, she has been on 10 mg PO daily in the hospital,  we will increase to 20 mg PO starting tomorrow and give an additional 10 mg one time now. She has taken PRN acetaminophen twice today. She had Vistaril PRN this morning. Patient has been visible in the milieu, interacting appropriately with staff and peers. She is attending and participating in some groups.  No new labs today.   Principal Problem: Major depressive disorder, recurrent episode, moderate (HCC) Diagnosis: Principal Problem:   Major depressive disorder, recurrent episode, moderate (HCC) Active Problems:   Polysubstance abuse (HCC)   Anxiety disorder, unspecified   Substance induced mood disorder (HCC)   Moderate cocaine use disorder (HCC)  Total Time spent with patient:  25 minutes  Past Psychiatric History: See admission H&P  Past Medical History:  Past Medical History:  Diagnosis Date   COPD without exacerbation (HCC) 03/01/2007   Qualifier: Diagnosis of  By:  Beverely Pace MD, Lyda Jester     Coronary artery disease involving native coronary artery of native heart 04/22/2021   Nicotine dependence, cigarettes, uncomplicated 04/22/2021   Polysubstance abuse (HCC) 04/22/2021   History reviewed. No pertinent surgical history. Family  History:  Family History  Problem Relation Age of Onset   Heart disease Neg Hx    Family Psychiatric  History: See admission H&P Social History:  Social History   Substance and Sexual Activity  Alcohol Use Not Currently     Social History   Substance and Sexual Activity  Drug Use Yes   Types: Cocaine, Methamphetamines, Marijuana    Social History   Socioeconomic History   Marital status: Legally Separated    Spouse name: Not on file   Number of children: Not on file   Years of education: Not on file   Highest education level: Not on file  Occupational History   Not on file  Tobacco Use   Smoking status: Every Day    Types: Cigarettes   Smokeless tobacco: Never  Vaping Use   Vaping Use: Never used  Substance and Sexual Activity   Alcohol use: Not Currently   Drug use: Yes    Types: Cocaine, Methamphetamines, Marijuana   Sexual activity: Yes    Birth control/protection: None  Other Topics Concern   Not on file  Social History Narrative   Not on file   Social Determinants of Health   Financial Resource Strain: Not on file  Food Insecurity: Not on file  Transportation Needs: Not on file  Physical Activity: Not on file  Stress: Not on file  Social Connections: Not on file   Additional Social History:       Sleep: Good  Appetite:  Good  Current Medications: Current Facility-Administered Medications  Medication Dose Route Frequency Provider Last Rate Last Admin   acetaminophen (TYLENOL) tablet 650 mg  650 mg Oral Q6H PRN Maryagnes Amos, FNP   650 mg at 05/02/21 1500   albuterol (VENTOLIN HFA) 108 (90 Base) MCG/ACT inhaler 2 puff  2 puff Inhalation Q6H PRN Claudie Revering, MD       alum & mag hydroxide-simeth (MAALOX/MYLANTA) 200-200-20 MG/5ML suspension 30 mL  30 mL Oral Q4H PRN Starkes-Perry, Juel Burrow, FNP       atorvastatin (LIPITOR) tablet 10 mg  10 mg Oral Daily Maryagnes Amos, FNP   10 mg at 05/02/21 0839   busPIRone (BUSPAR) tablet 5  mg  5 mg Oral BID Claudie Revering, MD   5 mg at 05/02/21 0839   DULoxetine (CYMBALTA) DR capsule 30 mg  30 mg Oral Daily Claudie Revering, MD   30 mg at 05/02/21 0839   gabapentin (NEURONTIN) capsule 200 mg  200 mg Oral BID Claudie Revering, MD   200 mg at 05/02/21 1443   hydrOXYzine (ATARAX/VISTARIL) tablet 25 mg  25 mg Oral TID PRN Claudie Revering, MD   25 mg at 05/02/21 0914   [START ON 05/03/2021] lisinopril (ZESTRIL) tablet 20 mg  20 mg Oral Daily Laveda Abbe, NP       magnesium hydroxide (MILK OF MAGNESIA) suspension 30 mL  30 mL Oral Daily PRN Maryagnes Amos, FNP       traZODone (DESYREL) tablet 50 mg  50 mg Oral QHS PRN Claudie Revering, MD   50 mg at 05/01/21 2123    Lab Results:  No results found for this or any previous visit (from the past 48  hour(s)).   Blood Alcohol level:  Lab Results  Component Value Date   ETH <10 04/21/2021    Metabolic Disorder Labs: Lab Results  Component Value Date   HGBA1C 5.4 04/22/2021   MPG 108.28 04/22/2021   No results found for: PROLACTIN Lab Results  Component Value Date   CHOL 194 04/22/2021   TRIG 106 04/22/2021   HDL 48 04/22/2021   CHOLHDL 4.0 04/22/2021   VLDL 21 04/22/2021   LDLCALC 125 (H) 04/22/2021   LDLCALC 154 (H) 03/13/2009    Physical Findings: AIMS:  , ,  ,  ,    CIWA:    COWS:     Musculoskeletal: Strength & Muscle Tone: within normal limits Gait & Station:  Slow steady gait due to discomfort from arthritis Patient leans: N/A  Psychiatric Specialty Exam:  Presentation  General Appearance: Appropriate for Environment; Fairly Groomed  Eye Contact:Good  Speech:Clear and Coherent; Normal Rate  Speech Volume:Normal  Handedness:Right   Mood and Affect  Mood:Anxious ("Better.  Less anxious")  Affect:Appropriate; Congruent   Thought Process  Thought Processes:Coherent; Goal Directed  Descriptions of Associations:Intact  Orientation:Full (Time, Place and Person)  Thought  Content:Logical; WDL  History of Schizophrenia/Schizoaffective disorder:No data recorded Duration of Psychotic Symptoms:No data recorded Hallucinations:No data recorded  Ideas of Reference:None  Suicidal Thoughts:No data recorded  Homicidal Thoughts:No data recorded  Sensorium  Memory:Immediate Good; Recent Good; Remote Good  Judgment:Fair  Insight:Fair  Executive Functions  Concentration:Good  Attention Span:Good  Recall:Good  Fund of Knowledge:Good  Language:Good  Psychomotor Activity  Psychomotor Activity:No data recorded  Assets  Assets:Communication Skills; Desire for Improvement; Financial Resources/Insurance; Housing; Resilience; Social Support  Sleep  Sleep:No data recorded  Physical Exam: Physical Exam Vitals and nursing note reviewed.  Constitutional:      General: She is not in acute distress.    Appearance: Normal appearance. She is not diaphoretic.  HENT:     Head: Normocephalic and atraumatic.  Cardiovascular:     Rate and Rhythm: Normal rate.  Pulmonary:     Effort: Pulmonary effort is normal.  Neurological:     General: No focal deficit present.     Mental Status: She is alert and oriented to person, place, and time.   Review of Systems  Constitutional:  Negative for chills, diaphoresis and fever.  HENT:  Negative for sore throat.   Respiratory:  Negative for cough and shortness of breath.   Cardiovascular:  Negative for chest pain and palpitations.  Gastrointestinal:  Negative for constipation, diarrhea, nausea and vomiting.  Genitourinary: Negative.   Musculoskeletal:  Positive for joint pain. Negative for myalgias.       Positive for chronic bilateral hip pain and chronic bilateral knee pain  Neurological:  Negative for dizziness, tremors and seizures.       Positive for chronic unilateral numbness of right side of face  Psychiatric/Behavioral:  Positive for substance abuse. Negative for depression, hallucinations and suicidal  ideas. The patient is nervous/anxious. The patient does not have insomnia.   All other systems reviewed and are negative.  Blood pressure (!) 161/88, pulse 83, temperature 98 F (36.7 C), temperature source Oral, resp. rate 16, height 5\' 6"  (1.676 m), weight 80.7 kg, SpO2 100 %. Body mass index is 28.73 kg/m. BP retake at 3:15 PM was 162/80.    Treatment Plan Summary: The patient is a 55 year old female with multiple medical problems and prior history of depression, anxiety and substance abuse who was admitted after she reported worsening  symptoms of depression and suicidal ideation in the context of recent relapse on cocaine.  Patient is tolerating initiation of duloxetine and buspirone which she has taken in the past.  She is denying depressed mood or suicidal ideation.  Patient reports desire to go to residential substance abuse treatment program after discharge and social work is investigating residential substance abuse treatment options for patient.  Daily contact with patient to assess and evaluate symptoms and progress in treatment and Medication management  Major depressive disorder, recurrent, without psychotic features -Continue duloxetine 30 mg daily for mood and anxiety.  Anticipate future upward titration. -Continue buspirone 5 mg twice daily for adjunctive treatment of mood and anxiety.  Anxiety disorder, unspecified -Continue duloxetine 30 mg daily for mood and anxiety.  Anticipate future upward titration. -Continue buspirone 5 mg twice daily for adjunctive treatment of mood and anxiety. -Increase gabapentin to 200 mg twice daily for chronic pain and anxiety -Continue hydroxyzine 25 mg 3 times daily PRN  Insomnia -Continue trazodone 50 mg nightly PRN  Substance use disorder (cocaine) -I have discussed with patient the risks of substance use and advised cessation of alcohol and drug use. -I have advised patient to participate in substance abuse treatment program after  discharge.  Patient is receptive to participating in residential substance abuse treatment program.  Social work has been investigating options for patient.  Abnormal MRI of the brain -MRI of the brain showed no acute intracranial pathology.  There was a 3.9 cm solid lesion centered around the right aspect of the clivus/sella that likely reflects a meningioma.  MRI also showed encasement of the cavernous ICA.  Repeat postcontrast imaging of the brain for better delineation of vascular involvement on a nonemergent outpatient basis was recommended.   Chronic facial numbness -Patient to follow-up as outpatient with PCP and neurology  Hypertension -Increase lisinopril to 20 mg daily starting 9/19 -Give additional one time dose of Lisinopril 10 mg PO now for BP 161/88  Elevated cholesterol -Continue Lipitor 10 mg daily  Chronic pain -Increase gabapentin to 200 mg twice daily for chronic pain and anxiety -Continue acetaminophen 650 mg Q6H PRN  COPD -Continue albuterol inhaler Q6H PRN  Discharge planning in progress.   Patient will need to follow-up with her PCP regarding multiple medical conditions.  She will need outpatient mental health therapy and psychiatric med management follow-up.  Social work is investigating possible substance abuse treatment programs for patient.     Laveda Abbe, NP 05/02/2021, 5:05 PM

## 2021-05-01 NOTE — Progress Notes (Signed)
   05/01/21 2249  Psych Admission Type (Psych Patients Only)  Admission Status Voluntary  Psychosocial Assessment  Patient Complaints None  Eye Contact Fair  Facial Expression Anxious  Affect Depressed  Speech Logical/coherent  Interaction Assertive  Motor Activity Slow  Appearance/Hygiene In scrubs  Behavior Characteristics Appropriate to situation  Mood Depressed  Thought Process  Coherency WDL  Content WDL  Delusions None reported or observed  Perception WDL  Hallucination None reported or observed  Judgment WDL  Confusion None  Danger to Self  Current suicidal ideation? Denies  Danger to Others  Danger to Others None reported or observed

## 2021-05-01 NOTE — Progress Notes (Signed)
[facility] MD Progress Note  05/01/2021 3:17 PM Carrie Adkins  MRN:  [id number]  Subjective: "I feel so much better today. I feel like I slept really well."   Objective:  Carrie Adkins. [name] is a 55 year old female with a history of polysubstance abuse (cocaine, marijuana, methamphetamine), depression and anxiety as well as multiple medical problems (COPD, hypertension, hyperlipidemia, diastolic CHF, history of MI, chronic pain, arthritis) who was admitted to [facility] from the inpatient medical unit at [facility] for further evaluation and treatment of depression and suicidal ideation.  The patient initially presented to the emergency department at [facility] on 04/21/2021 reporting multiple physical complaints including diffuse tingling, unsteady gait and vaginal discharge in the setting of recent relapse on crack cocaine.  Patient also made statements about not wanting to be a burden on others and planning to ingest pills.  In the ED, head CT was unremarkable, BAL was undetectable and urine tox screen was positive for amphetamines, cocaine and THC.  Evaluation on the unit: Patient was seen and evaluated, chart reviewed and case discussed with the treatment team.  Patient stated she feels she slept well last night, record reflects she slept 6.75 hours. Patient reported a good appetite. She presents with better mood and stable affect. Patient reported depression and anxiety of 1/10 with 10 being the worst. She stated she is taking her medications and has no issues with them. She reported she stopped taking her medications, duloxetine and buspirone a while ago because she thought she was okay.  Her sister passed away 3 years ago this month and she was put on medication for depression and anxiety during that time. She was her sister's caregiver and had to make the decision to take her off of life support. She stated has been self medicating with substances ever since. Her UDS was positive for  amphetamines, cocaine and THC on admission.  She denies drug cravings. Patient denies  SI/HI, AH or VH, paranoia and delusions. She does not appear to be responding to internal stimuli. She is hopeful to go to a residential treatment program and has been discussing this with the Child psychotherapist. Patient states that she can not return to [facility] live with her daughter unless she goes to a treatment program. She denies any side effects to her medication. Patient complains of chronic bilateral hip pain and bilateral knee pain secondary to arthritis.  She denies any other physical issues and is taking Tylenol for the pain. Vital signs this morning include BP 162/80, pulse 76, respirations 16 and temperature 98.5. She has taken PRN acetaminophen once today. She had Vistaril PRN this morning due to two patients on the 400 hall being agitated and causing upset in the milieu.  Patient has been visible in the milieu, interacting appropriately with staff and peers. She is attending and participating in some groups.  No  medication changes or labs today.   Principal Problem: Major depressive disorder, recurrent episode, moderate (HCC) Diagnosis: Principal Problem:   Major depressive disorder, recurrent episode, moderate (HCC) Active Problems:   Polysubstance abuse (HCC)   Anxiety disorder, unspecified   Substance induced mood disorder (HCC)   Moderate cocaine use disorder (HCC)  Total Time spent with patient:  25 minutes  Past Psychiatric History: See admission H&P  Past Medical History:  Past Medical History:  Diagnosis Date   COPD without exacerbation (HCC) 03/01/2007   Qualifier: Diagnosis of  By: Beverely Pace MD, Curtis     Coronary artery disease involving  native coronary artery of native heart 04/22/2021   Nicotine dependence, cigarettes, uncomplicated 04/22/2021   Polysubstance abuse (HCC) 04/22/2021   History reviewed. No pertinent surgical history. Family History:  Family History  Problem Relation Age of  Onset   Heart disease Neg Hx    Family Psychiatric  History: See admission H&P Social History:  Social History   Substance and Sexual Activity  Alcohol Use Not Currently     Social History   Substance and Sexual Activity  Drug Use Yes   Types: Cocaine, Methamphetamines, Marijuana    Social History   Socioeconomic History   Marital status: Legally Separated    Spouse name: Not on file   Number of children: Not on file   Years of education: Not on file   Highest education level: Not on file  Occupational History   Not on file  Tobacco Use   Smoking status: Every Day    Types: Cigarettes   Smokeless tobacco: Never  Vaping Use   Vaping Use: Never used  Substance and Sexual Activity   Alcohol use: Not Currently   Drug use: Yes    Types: Cocaine, Methamphetamines, Marijuana   Sexual activity: Yes    Birth control/protection: None  Other Topics Concern   Not on file  Social History Narrative   Not on file   Social Determinants of Health   Financial Resource Strain: Not on file  Food Insecurity: Not on file  Transportation Needs: Not on file  Physical Activity: Not on file  Stress: Not on file  Social Connections: Not on file   Additional Social History:       Sleep: Good  Appetite:  Good  Current Medications: Current Facility-Administered Medications  Medication Dose Route Frequency Provider Last Rate Last Admin   acetaminophen (TYLENOL) tablet 650 mg  650 mg Oral Q6H PRN Maryagnes Amos, FNP   650 mg at 05/01/21 0822   albuterol (VENTOLIN HFA) 108 (90 Base) MCG/ACT inhaler 2 puff  2 puff Inhalation Q6H PRN Claudie Revering, MD       alum & mag hydroxide-simeth (MAALOX/MYLANTA) 200-200-20 MG/5ML suspension 30 mL  30 mL Oral Q4H PRN Starkes-Perry, Juel Burrow, FNP       atorvastatin (LIPITOR) tablet 10 mg  10 mg Oral Daily Maryagnes Amos, FNP   10 mg at 05/01/21 0820   busPIRone (BUSPAR) tablet 5 mg  5 mg Oral BID Claudie Revering, MD   5 mg at  05/01/21 0820   DULoxetine (CYMBALTA) DR capsule 30 mg  30 mg Oral Daily Claudie Revering, MD   30 mg at 05/01/21 0820   gabapentin (NEURONTIN) capsule 200 mg  200 mg Oral BID Claudie Revering, MD   200 mg at 05/01/21 0820   hydrOXYzine (ATARAX/VISTARIL) tablet 25 mg  25 mg Oral TID PRN Claudie Revering, MD   25 mg at 05/01/21 0925   lisinopril (ZESTRIL) tablet 10 mg  10 mg Oral Daily Maryagnes Amos, FNP   10 mg at 05/01/21 0820   magnesium hydroxide (MILK OF MAGNESIA) suspension 30 mL  30 mL Oral Daily PRN Maryagnes Amos, FNP       traZODone (DESYREL) tablet 50 mg  50 mg Oral QHS PRN Claudie Revering, MD        Lab Results:  Results for orders placed or performed during the hospital encounter of 04/28/21 (from the past 48 hour(s))  TSH     Status: None  Collection Time: 04/29/21  6:17 PM  Result Value Ref Range   TSH 2.559 0.350 - 4.500 uIU/mL    Comment: Performed by a 3rd Generation assay with a functional sensitivity of <=0.01 uIU/mL. Performed at [facility], [address], [place], [place] [address]   T4, free     Status: None   Collection Time: 04/29/21  6:17 PM  Result Value Ref Range   Free T4 0.67 0.61 - 1.12 ng/dL    Comment: (NOTE) Biotin ingestion may interfere with free T4 tests. If the results are inconsistent with the TSH level, previous test results, or the clinical presentation, then consider biotin interference. If needed, order repeat testing after stopping biotin. Performed at [facility], [address] [place], [place] [address]   hCG, serum, qualitative     Status: None   Collection Time: 04/29/21  6:17 PM  Result Value Ref Range   Preg, Serum NEGATIVE NEGATIVE    Comment: Performed at [facility], [address] [place], [place] [address]    Blood Alcohol level:  Lab Results  Component Value Date   ETH <10 04/21/2021    Metabolic Disorder Labs: Lab Results  Component Value Date    HGBA1C 5.4 04/22/2021   MPG 108.28 04/22/2021   No results found for: PROLACTIN Lab Results  Component Value Date   CHOL 194 04/22/2021   TRIG 106 04/22/2021   HDL 48 04/22/2021   CHOLHDL 4.0 04/22/2021   VLDL 21 04/22/2021   LDLCALC 125 (H) 04/22/2021   LDLCALC 154 (H) 03/13/2009    Physical Findings: AIMS:  , ,  ,  ,    CIWA:    COWS:     Musculoskeletal: Strength & Muscle Tone: within normal limits Gait & Station:  Slow steady gait due to discomfort from arthritis Patient leans: N/A  Psychiatric Specialty Exam:  Presentation  General Appearance: Appropriate for Environment; Fairly Groomed  Eye Contact:Good  Speech:Clear and Coherent; Normal Rate  Speech Volume:Normal  Handedness:Right   Mood and Affect  Mood:Anxious ("Better.  Less anxious")  Affect:Appropriate; Congruent   Thought Process  Thought Processes:Coherent; Goal Directed  Descriptions of Associations:Intact  Orientation:Full (Time, Place and Person)  Thought Content:Logical; WDL  History of Schizophrenia/Schizoaffective disorder:No data recorded Duration of Psychotic Symptoms:No data recorded Hallucinations:Hallucinations: None  Ideas of Reference:None  Suicidal Thoughts:Suicidal Thoughts: No  Homicidal Thoughts:Homicidal Thoughts: No  Sensorium  Memory:Immediate Good; Recent Good; Remote Good  Judgment:Fair  Insight:Fair  Executive Functions  Concentration:Good  Attention Span:Good  Recall:Good  Fund of Knowledge:Good  Language:Good  Psychomotor Activity  Psychomotor Activity:Psychomotor Activity: Normal  Assets  Assets:Communication Skills; Desire for Improvement; Financial Resources/Insurance; Housing; Resilience; Social Support  Sleep  Sleep:Sleep: Fair Number of Hours of Sleep: 6.75  Physical Exam: Physical Exam Vitals and nursing note reviewed.  Constitutional:      General: She is not in acute distress.    Appearance: Normal appearance. She is not  diaphoretic.  HENT:     Head: Normocephalic and atraumatic.  Cardiovascular:     Rate and Rhythm: Normal rate.  Pulmonary:     Effort: Pulmonary effort is normal.  Neurological:     General: No focal deficit present.     Mental Status: She is alert and oriented to person, place, and time.   Review of Systems  Constitutional:  Negative for chills, diaphoresis and fever.  HENT:  Negative for sore throat.   Respiratory:  Negative for cough and  shortness of breath.   Cardiovascular:  Negative for chest pain and palpitations.  Gastrointestinal:  Negative for constipation, diarrhea, nausea and vomiting.  Genitourinary: Negative.   Musculoskeletal:  Positive for joint pain. Negative for myalgias.       Positive for chronic bilateral hip pain and chronic bilateral knee pain  Neurological:  Negative for dizziness, tremors and seizures.       Positive for chronic unilateral numbness of right side of face  Psychiatric/Behavioral:  Positive for substance abuse. Negative for depression, hallucinations and suicidal ideas. The patient is nervous/anxious. The patient does not have insomnia.   All other systems reviewed and are negative.  Blood pressure (!) 164/90, pulse 76, temperature 98.5 F (36.9 C), temperature source Oral, resp. rate 16, height 5\' 6"  (1.676 m), weight 80.7 kg, SpO2 100 %. Body mass index is 28.73 kg/m. BP retake at 3:15 PM was 162/80.    Treatment Plan Summary: The patient is a 55 year old female with multiple medical problems and prior history of depression, anxiety and substance abuse who was admitted after she reported worsening symptoms of depression and suicidal ideation in the context of recent relapse on cocaine.  Patient is tolerating initiation of duloxetine and buspirone which she has taken in the past.  She is denying depressed mood or suicidal ideation.  Patient reports desire to go to residential substance abuse treatment program after discharge and social work is  investigating residential substance abuse treatment options for patient.  Daily contact with patient to assess and evaluate symptoms and progress in treatment and Medication management  Major depressive disorder, recurrent, without psychotic features -Continue duloxetine 30 mg daily for mood and anxiety.  Anticipate future upward titration. -Continue buspirone 5 mg twice daily for adjunctive treatment of mood and anxiety.  Anxiety disorder, unspecified -Continue duloxetine 30 mg daily for mood and anxiety.  Anticipate future upward titration. -Continue buspirone 5 mg twice daily for adjunctive treatment of mood and anxiety. -Increase gabapentin to 200 mg twice daily for chronic pain and anxiety -Continue hydroxyzine 25 mg 3 times daily PRN  Insomnia -Continue trazodone 50 mg nightly PRN  Substance use disorder (cocaine) -I have discussed with patient the risks of substance use and advised cessation of alcohol and drug use. -I have advised patient to participate in substance abuse treatment program after discharge.  Patient is receptive to participating in residential substance abuse treatment program.  Social work has been investigating options for patient.  Abnormal MRI of the brain -MRI of the brain showed no acute intracranial pathology.  There was a 3.9 cm solid lesion centered around the right aspect of the clivus/sella that likely reflects a meningioma.  MRI also showed encasement of the cavernous ICA.  Repeat postcontrast imaging of the brain for better delineation of vascular involvement on a nonemergent outpatient basis was recommended.   Chronic facial numbness -Patient to follow-up as outpatient with PCP and neurology  Hypertension -Continue lisinopril 10 mg daily  Elevated cholesterol -Continue Lipitor 10 mg daily  Chronic pain -Increase gabapentin to 200 mg twice daily for chronic pain and anxiety -Continue acetaminophen 650 mg Q6H PRN  COPD -Continue albuterol  inhaler Q6H PRN  Discharge planning in progress.   Patient will need to follow-up with her PCP regarding multiple medical conditions.  She will need outpatient mental health therapy and psychiatric med management follow-up.  Social work is investigating possible substance abuse treatment programs for patient.     56, NP 05/01/2021, 3:17 PM

## 2021-05-01 NOTE — Group Note (Signed)
LCSW Group Therapy Note  05/01/2021  10-11am  Type of Therapy and Topic:  Group Therapy: "My Mental Health"  Participation Level:  Active   Description of Group:   In this group, patients were asked four questions in order to generate discussion around the idea of mental illness and/or substance addiction being medical problems: In one sentence describe the current state of your mental health or substance use. How much do you feel similar to or different from others? Do you tend to identify with other people or compare yourself to them?  In a word or sentence, share what you desire your mental health or substance use to be moving forward.  Discussion was held that led to the conclusion that comparing ourselves to others is not healthy, but identifying with the elements of their issues that are similar to ours is helpful.    Therapeutic Goals:  Patients will identify their feelings about their current mental health/substance use problems. Patients will describe how they feel similar to or different from others, and whether they tend to identify with or compare themselves to other people with the same issues. Patients will explore the differences in these concepts and how a change of mindset about mental health/substance use can help with reaching recovery goals. Patients will think about and share what their recovery goals are, in terms of mental health and/or substance use.  Summary of Patient Progress:  The patient shared that she feels great currently, but is focused on what is the next step for her recovery, thinks she may be going to a facility in [place] for 30 days.  She said her substance abuse has ended and she has lost "too much" as a result of it.  She wants to become the "fun" grandma she used to be.  She said that she left her ex-husband because of his negativity and that was one step to becoming who she wants to be.  She spoke when called on, always on topic.  Therapeutic  Modalities:   Processing Psychoeducation  [name], MSW, LCSW

## 2021-05-01 NOTE — Progress Notes (Signed)
Adult Psychoeducational Group Note  Date:  05/01/2021 Time:  11:01 PM  Group Topic/Focus:  Wrap-Up Group:   The focus of this group is to help patients review their daily goal of treatment and discuss progress on daily workbooks.  Participation Level:  Active  Participation Quality:  Appropriate and Attentive  Affect:  Appropriate  Cognitive:  Alert and Appropriate  Insight: Appropriate and Good  Engagement in Group:  Engaged  Modes of Intervention:  Discussion  Additional Comments: This Clinical research associate facilitated a Firefighter- Up group to support the patient with increasing awareness of their emotions and discuss their goals, progress, and objectives and how group skills were demonstrated on this date. Review and discuss patient's goals providing guidelines for improving their ability to engage in self-reflection. Pt self-reporting has an overall day of a 9 out of 10 on this date. End of Wrap-Up Group progress note.   Nicoletta Dress 05/01/2021, 11:01 PM

## 2021-05-01 NOTE — Progress Notes (Signed)
     04/30/21 2350  Psych Admission Type (Psych Patients Only)  Admission Status Voluntary  Psychosocial Assessment  Patient Complaints None  Eye Contact Fair  Facial Expression Anxious  Affect Depressed;Anxious  Speech Logical/coherent  Interaction Assertive  Motor Activity Slow  Appearance/Hygiene In scrubs  Behavior Characteristics Cooperative  Mood Depressed;Anxious  Thought Process  Coherency WDL  Content WDL  Delusions None reported or observed  Perception WDL  Hallucination None reported or observed  Judgment WDL  Confusion None  Danger to Self  Current suicidal ideation? Denies  Danger to Others  Danger to Others None reported or observed

## 2021-05-01 NOTE — Plan of Care (Signed)
  Problem: Education: Goal: Emotional status will improve Outcome: Not Progressing Goal: Mental status will improve Outcome: Not Progressing Goal: Verbalization of understanding the information provided will improve Outcome: Not Progressing   

## 2021-05-02 MED ORDER — LISINOPRIL 10 MG PO TABS
10.0000 mg | ORAL_TABLET | Freq: Once | ORAL | Status: AC
  Administered 2021-05-02: 10 mg via ORAL
  Filled 2021-05-02: qty 1

## 2021-05-02 MED ORDER — LISINOPRIL 20 MG PO TABS
20.0000 mg | ORAL_TABLET | Freq: Every day | ORAL | Status: DC
  Administered 2021-05-03 – 2021-05-04 (×2): 20 mg via ORAL
  Filled 2021-05-02 (×3): qty 1

## 2021-05-02 NOTE — [facility] Group Notes (Signed)
[facility] Group Notes:  (Nursing/MHT/Case Management/Adjunct)  Date:  05/02/2021  Time:  8:22 PM  Type of Therapy:  Group Therapy  Participation Level:  Active  Participation Quality:  Attentive  Affect:  Appropriate  Cognitive:  Appropriate  Insight:  Improving  Engagement in Group:  Developing/Improving  Modes of Intervention:  Discussion  Summary of Progress/Problems:  Carrie Adkins 05/02/2021, 8:22 PM

## 2021-05-02 NOTE — Progress Notes (Signed)
   05/02/21 1700  Psych Admission Type (Psych Patients Only)  Admission Status Voluntary  Psychosocial Assessment  Patient Complaints Anxiety  Eye Contact Fair  Facial Expression Anxious  Affect Depressed  Speech Logical/coherent  Interaction Assertive  Motor Activity Slow  Appearance/Hygiene In scrubs  Behavior Characteristics Appropriate to situation  Mood Depressed  Thought Process  Coherency WDL  Content WDL  Delusions None reported or observed  Perception WDL  Hallucination None reported or observed  Judgment WDL  Confusion None  Danger to Self  Current suicidal ideation? Denies  Danger to Others  Danger to Others None reported or observed

## 2021-05-02 NOTE — [facility] Group Notes (Signed)
[facility] Group Notes:  (Nursing/MHT/Case Management/Adjunct)  Date:  05/02/2021  Time:  4:47 PM  Type of Therapy:  Group Therapy  Participation Level:  Active  Participation Quality:  Appropriate  Affect:  Appropriate  Cognitive:  Appropriate  Insight:  Appropriate and Good  Engagement in Group:  Engaged  Modes of Intervention:  Education and Support  Summary of Progress/Problems: Topic was support systems. Pt says that her support was her church and pastor. She said that she needs to be open and honest about her condition.   Janai Maudlin J Sweden Lesure 05/02/2021, 4:47 PM

## 2021-05-02 NOTE — Group Note (Signed)
[facility] Group Notes: (Clinical Social Work)   05/02/2021      Type of Therapy:  Group Therapy   Participation Level:  Did Not Attend - was invited both individually by MHT and by overhead announcement, chose not to attend.   Ambrose Mantle, LCSW 05/02/2021, 4:11 PM

## 2021-05-02 NOTE — Progress Notes (Signed)
   05/02/21 2258  Psych Admission Type (Psych Patients Only)  Admission Status Voluntary  Psychosocial Assessment  Patient Complaints Insomnia  Eye Contact Fair  Facial Expression Anxious  Affect Appropriate to circumstance  Speech Logical/coherent  Interaction Assertive  Motor Activity Slow  Appearance/Hygiene In scrubs  Behavior Characteristics Appropriate to situation  Mood Pleasant  Thought Process  Coherency WDL  Content WDL  Delusions None reported or observed  Perception WDL  Hallucination None reported or observed  Judgment WDL  Confusion None  Danger to Self  Current suicidal ideation? Denies  Danger to Others  Danger to Others None reported or observed

## 2021-05-02 NOTE — [facility] Group Notes (Signed)
[facility] Group Notes:  (Nursing/MHT/Case Management/Adjunct)  Date:  05/02/2021  Time:  9:09 AM  Type of Therapy:  Group Therapy  Participation Level:  Active  Participation Quality:  Appropriate  Affect:  Appropriate  Cognitive:  Appropriate  Insight:  Appropriate  Engagement in Group:  Engaged  Modes of Intervention:  Orientation  Summary of Progress/Problems: Pt goal for today is to be more engaged and work towards getting back to her life.  Jaedynn Bohlken J Skye Rodarte 05/02/2021, 9:09 AM

## 2021-05-03 MED ORDER — ASPIRIN 81 MG PO CHEW: 81.0000 mg | CHEWABLE_TABLET | Freq: Every day | ORAL | 0 refills | Status: DC

## 2021-05-03 MED ORDER — DULOXETINE HCL 30 MG PO CPEP: 30.0000 mg | ORAL_CAPSULE | Freq: Two times a day (BID) | ORAL | 0 refills | Status: DC

## 2021-05-03 MED ORDER — HYDROXYZINE HCL 25 MG PO TABS: 25.0000 mg | ORAL_TABLET | Freq: Three times a day (TID) | ORAL | 0 refills | Status: DC | PRN

## 2021-05-03 MED ORDER — ASPIRIN 81 MG PO CHEW
81.0000 mg | CHEWABLE_TABLET | Freq: Every day | ORAL | Status: DC
  Administered 2021-05-03 – 2021-05-04 (×2): 81 mg via ORAL
  Filled 2021-05-03 (×3): qty 1

## 2021-05-03 MED ORDER — BUSPIRONE HCL 5 MG PO TABS: 5.0000 mg | ORAL_TABLET | Freq: Two times a day (BID) | ORAL | 0 refills | Status: DC

## 2021-05-03 MED ORDER — DULOXETINE HCL 30 MG PO CPEP
30.0000 mg | ORAL_CAPSULE | Freq: Two times a day (BID) | ORAL | Status: DC
  Administered 2021-05-03 – 2021-05-04 (×2): 30 mg via ORAL
  Filled 2021-05-03 (×4): qty 1

## 2021-05-03 MED ORDER — TRAZODONE HCL 50 MG PO TABS: 50.0000 mg | ORAL_TABLET | Freq: Every evening | ORAL | 0 refills | Status: DC | PRN

## 2021-05-03 MED ORDER — LISINOPRIL 20 MG PO TABS: 20.0000 mg | ORAL_TABLET | Freq: Every day | ORAL | 0 refills | Status: DC

## 2021-05-03 MED ORDER — ATORVASTATIN CALCIUM 10 MG PO TABS: 10.0000 mg | ORAL_TABLET | Freq: Every day | ORAL | 0 refills | Status: DC

## 2021-05-03 MED ORDER — GABAPENTIN 100 MG PO CAPS: 200.0000 mg | ORAL_CAPSULE | Freq: Two times a day (BID) | ORAL | 0 refills | Status: DC

## 2021-05-03 MED ORDER — ALBUTEROL SULFATE HFA 108 (90 BASE) MCG/ACT IN AERS: 2.0000 | INHALATION_SPRAY | Freq: Four times a day (QID) | RESPIRATORY_TRACT | 0 refills | Status: DC | PRN

## 2021-05-03 NOTE — [facility] Counselor (Signed)
CSW spoke with Lorrie at [facility] who states that the Pt has been accepted for 05/04/2021.  CSW will fax over progress notes and arrange transportation for this date.

## 2021-05-03 NOTE — Progress Notes (Signed)
Pt rates her anxiety and depression both 5/10, hopelessness 0/10 with current stressor being placement issues and peers leaving today. Pt's goal for today "Is getting to the next level of recovery" which she plans to achieve by being "Open about my feelings and addiction".   Reports she slept well with good appetite, normal energy and good concentration level. Denies SI, HI and AVH when assessed. Pt received PRN tylenol 650 mg PO twice for complain of lower back pain and Vistaril 25 mg PO X1 (see emar) this shift. Reported relief when reassessed. Observed in scheduled groups and was engaged in activities. Q 15 minutes safety checks maintained without self harm gestures or outburst thus far. All medications administered with verbal education and effects monitored. Support and encouragement offered to comply with current treatment regimen including unit routines.  Pt updated on placement in [place] and about discharge orders and time by assigned CSW and Clinical research associate.  Pt expressed gratitude and excitement about being discharge tomorrow. Tolerates all PO intake well. Remains safe on and off unit.

## 2021-05-03 NOTE — Progress Notes (Signed)
The patient's positive event for the day is that she had a good talk with her mother as well as with her daughter. More importantly, she learned today that she has been accepted into a long-term treatment program.

## 2021-05-03 NOTE — [facility] Counselor (Signed)
CSW provided the Pt with the phone number for [facility] and [facility] to complete her intake calls with both facilities. CSW also included the reference number for [employer].  CSW also provided the Pt with a packet that contacted information for shelters/housings, free/reduced price food resources, IRC information, [employer], and Suicide Prevention Information.  CSW will follow up with the Pt to see how her intakes went with the 2 inpatient treatment facilities.

## 2021-05-03 NOTE — Group Note (Signed)
LCSW Group Therapy Note   Group Date: 05/03/2021 Start Time: 1300 End Time: 1330  LCSW Aftercare Discharge Planning Group Note  Type of Group and Topic: Psychoeducational Group: Discharge Planning  Participation Level: Active  Description of Group: Discharge planning group reviews patient's anticipated discharge plans and assists patients to anticipate and address any barriers to wellness/recovery in the community. Suicide prevention education is reviewed with patients in group.  Therapeutic Goals  1. Patients will state their anticipated discharge plan and mental health aftercare 2. Patients will identify potential barriers to wellness in the community setting 3. Patients will engage in problem solving, solution focused discussion of ways to anticipate and address barriers to wellness/recovery   Therapeutic Modalities: Motivational Interviewing Felizardo Hoffmann, Theresia Majors 05/03/2021  2:09 PM

## 2021-05-03 NOTE — Group Note (Signed)
Recreation Therapy Group Note   Group Topic:Coping Skills  Group Date: 05/03/2021 Start Time: 1000 End Time: 1045 Facilitators: Caroll Rancher, LRT/CTRS Location: [address] Dayroom  Goal Area(s) Addresses:  Patient will identify difference between positive and negative coping skills. Patient will identify positive coping skills. Patient will identify benefits of using positive coping skills post d/c.  Group Description:  Each patient was give a blank map diagram.  LRT and patients filled the first 8 boxes in together with different diagnosis or situations (anxiety, depression, mania, loneliness, abuse, hopelessness, lack of motivation and addition) in which coping skills would be needed.  Patients were given 15-20 minutes to come up with at least 3 copings for each one.   When time was up, LRT would bring the group together and start writing their responses on the board.  If anyone had any blank space on their paper, they could fill in the blank spots from what was identified on the board.   Affect/Mood: Appropriate   Participation Level: Engaged   Participation Quality: Independent   Behavior: Appropriate   Speech/Thought Process: Focused   Insight: Good   Judgement: Good   Modes of Intervention: Guided Discussion and Worksheet   Patient Response to Interventions:  Attentive and Engaged   Education Outcome:  Acknowledges education and In group clarification offered    Clinical Observations/Individualized Feedback: Pt was called out of group multiple times to meet with doctor and Child psychotherapist.  While in group, pt had to do some catching up to the rest the group but pt was focused and attentive.  Pt identified coping skills like support groups, talk to someone, music, find a group, sharing and go for a walk.   Plan: Continue to engage patient in RT group sessions 2-3x/week.   Caroll Rancher, LRT/CTRS 05/03/2021 1:04 PM

## 2021-05-03 NOTE — [facility] Group Notes (Signed)
Patient identified getting to the next level of her recovery as an important goal today. Patient has secured a clean and sober living facility   to go to after discharged. She understands her need to apply the coping skills she has learned since being here as evidenced by acknowledging the importance of boundaries, triggers,communication strategies and ways to handle conflicts when they arise.

## 2021-05-03 NOTE — Discharge Summary (Addendum)
Physician Discharge Summary Note  Patient:  Carrie Adkins is an 55 y.o., female MRN:  [id number] DOB:  1965-08-26 Patient phone:  [phone] (home)  Patient address:   [place] [address],  Total Time spent with patient: 30 minutes  Date of Admission:  04/28/2021 Date of Discharge: 05/04/2021  Reason for Admission:  (From MD's admission note):   Principal Problem: Major depressive disorder, recurrent episode, moderate (HCC) Discharge Diagnoses: Principal Problem:   Major depressive disorder, recurrent episode, moderate (HCC) Active Problems:   Polysubstance abuse (HCC)   Anxiety disorder, unspecified   Substance induced mood disorder (HCC)   Moderate cocaine use disorder (HCC)   Past Psychiatric History: Patient reports that she was diagnosed with depression and anxiety 3 years ago and has received treatment from Dr. Cristy Hilts at [facility] in [facility] during that time.  Patient was unable to recall the names of her medication.  Review of Care Everywhere notes shows that patient was previously taking duloxetine and buspirone.  When asked about these medications, the patient states she believes both of these medications were helpful.  She has not taken either of them consistently in recent months and would like to restart them.  Patient denies any history of prior inpatient psychiatric hospitalizations.  She gives a history of 1 prior suicide attempt at age 35 by overdose on pills after a break-up with a boyfriend.  Patient denies any history of nonsuicidal self-injurious behavior.  Past Medical History:  Past Medical History:  Diagnosis Date   COPD without exacerbation (HCC) 03/01/2007   Qualifier: Diagnosis of  By: Beverely Pace MD, Curtis     Coronary artery disease involving native coronary artery of native heart 04/22/2021   Nicotine dependence, cigarettes, uncomplicated 04/22/2021   Polysubstance abuse (HCC) 04/22/2021   History reviewed. No pertinent  surgical history. Family History:  Family History  Problem Relation Age of Onset   Heart disease Neg Hx    Family Psychiatric  History: Patient states that one of her nieces has a mood disorder.  She says that she has 1 brother with substance use issues.  She denies any family history of suicide.  Social History:  Social History   Substance and Sexual Activity  Alcohol Use Not Currently     Social History   Substance and Sexual Activity  Drug Use Yes   Types: Cocaine, Methamphetamines, Marijuana    Social History   Socioeconomic History   Marital status: Legally Separated    Spouse name: Not on file   Number of children: Not on file   Years of education: Not on file   Highest education level: Not on file  Occupational History   Not on file  Tobacco Use   Smoking status: Every Day    Types: Cigarettes   Smokeless tobacco: Never  Vaping Use   Vaping Use: Never used  Substance and Sexual Activity   Alcohol use: Not Currently   Drug use: Yes    Types: Cocaine, Methamphetamines, Marijuana   Sexual activity: Yes    Birth control/protection: None  Other Topics Concern   Not on file  Social History Narrative   Not on file   Social Determinants of Health   Financial Resource Strain: Not on file  Food Insecurity: Not on file  Transportation Needs: Not on file  Physical Activity: Not on file  Stress: Not on file  Social Connections: Not on file    Hospital Course:  After the above admission evaluation, Carrie Adkins's  presenting symptoms were noted. She was recommended for mood stabilization treatments. The medication regimen targeting those presenting symptoms were discussed with her & initiated with her consent. She was started on Cymbalta for depression, which would also help with her chronic joint pain. She is also taking Buspar for her anxiety, Vistaril PRN for breakthrough anxiety, Neurontin for drug cravings and anxiety, and Trazodone PRN for sleep. Gabapentin was  discontinued at discharge as [name] does not allow this medication at their facility. Her home medications of ASA 81 mg daily, Lipitor 10 mg daily for hyperlipidemia and lisinopril 20 mg daily for hypertension. Her UDS on arrival to the ED was positive for THC, amphetamines, and cocaine, BAL was negative. She was however medicated, stabilized & discharged on the medications as listed on her discharge medication list below. Besides the mood stabilization treatments, Carrie Adkins was also enrolled & participated in the group counseling sessions being offered & held on this unit. She learned coping skills. She presented no other significant pre-existing medical issues that required treatment. She tolerated his treatment regimen without any adverse effects or reactions reported.   During the course of her hospitalization, the 15-minute checks were adequate to ensure patient's safety. Carrie Adkins did not display any dangerous, violent or suicidal behavior on the unit.  She interacted with patients & staff appropriately, participated appropriately in the group sessions/therapies. Her medications were addressed & adjusted to meet her needs. She was recommended for outpatient follow-up care & medication management upon discharge to assure continuity of care & mood stability.  At the time of discharge patient is not reporting any acute suicidal/homicidal ideations. She feels more confident about her self-care & in managing her mental health. She currently denies any new issues or concerns. Education and supportive counseling provided throughout her hospital stay & upon discharge.   Today upon her discharge evaluation with the attending psychiatrist, Carrie Adkins shares she is doing well. She denies any other specific concerns. She is sleeping well. Her appetite is good. She denies other physical complaints. She denies AH/VH, delusional thoughts or paranoia. She does not appear to be responding to any  internal stimuli. She feels that her medications have been helpful & is in agreement to continue her current treatment regimen as recommended. She was able to engage in safety planning including plan to return to [facility] or contact emergency services if she feels unable to maintain her own safety or the safety of others. Pt had no further questions, comments, or concerns. She left [facility] with all personal belongings in no apparent distress. Transportation to [employer] per [employer].    Physical Findings: AIMS:  , ,  ,  ,    CIWA:    COWS:     Musculoskeletal: Strength & Muscle Tone: within normal limits Gait & Station: normal Patient leans: N/A  Psychiatric Specialty Exam:  Presentation  General Appearance: Appropriate for Environment; Casual  Eye Contact:Good  Speech:Clear and Coherent; Normal Rate  Speech Volume:Normal  Handedness:Right  Mood and Affect  Mood:Euthymic  Affect:Appropriate; Congruent  Thought Process  Thought Processes:Coherent; Goal Directed  Descriptions of Associations:Intact  Orientation:Full (Time, Place and Person)  Thought Content:Logical  History of Schizophrenia/Schizoaffective disorder:No data recorded Duration of Psychotic Symptoms:No data recorded Hallucinations:Hallucinations: None Ideas of Reference:None  Suicidal Thoughts:Suicidal Thoughts: No Homicidal Thoughts:Homicidal Thoughts: No  Sensorium  Memory:Immediate Good; Recent Good; Remote Good  Judgment:Fair  Insight:Fair  Executive Functions  Concentration:Good  Attention Span:Good  Recall:Good  Fund of Knowledge:Good  Language:Good  Psychomotor Activity  Psychomotor Activity: Psychomotor Activity: Normal  Assets  Assets:Communication Skills; Desire for Improvement; Financial Resources/Insurance; Resilience; Social Support  Sleep  Sleep: Sleep: Good Number of Hours of Sleep: 6.75  Physical Exam: Physical Exam Constitutional:       Appearance: Normal appearance.  HENT:     Head: Normocephalic.  Pulmonary:     Effort: Pulmonary effort is normal.  Musculoskeletal:        General: Normal range of motion.     Cervical back: Normal range of motion.  Neurological:     General: No focal deficit present.     Mental Status: She is alert and oriented to person, place, and time.  Psychiatric:        Attention and Perception: Attention normal. She does not perceive auditory or visual hallucinations.        Mood and Affect: Mood normal.        Speech: Speech normal.        Behavior: Behavior normal. Behavior is cooperative.        Thought Content: Thought content normal. Thought content is not paranoid or delusional. Thought content does not include homicidal or suicidal ideation. Thought content does not include homicidal or suicidal plan.        Cognition and Memory: Cognition normal.     Comments: Patient's mood has improved since admission. She has denied depression and anxiety for the past 2-3 days.    Review of Systems  Constitutional: Negative.  Negative for fever.  HENT: Negative.  Negative for congestion, sinus pain and sore throat.   Respiratory: Negative.  Negative for cough and shortness of breath.   Cardiovascular: Negative.  Negative for chest pain.  Gastrointestinal: Negative.   Genitourinary: Negative.   Musculoskeletal:  Positive for joint pain and myalgias.  Neurological: Negative.    Blood pressure (!) 156/88, pulse 80, temperature 98.2 F (36.8 C), temperature source Oral, resp. rate 16, height 5\' 6"  (1.676 m), weight 80.7 kg, SpO2 100 %. Body mass index is 28.73 kg/m.   Social History   Tobacco Use  Smoking Status Every Day   Types: Cigarettes  Smokeless Tobacco Never   Tobacco Cessation:  N/A, patient does not currently use tobacco products   Blood Alcohol level:  Lab Results  Component Value Date   ETH <10 04/21/2021    Metabolic Disorder Labs:  Lab Results  Component Value Date    HGBA1C 5.4 04/22/2021   MPG 108.28 04/22/2021   No results found for: PROLACTIN Lab Results  Component Value Date   CHOL 194 04/22/2021   TRIG 106 04/22/2021   HDL 48 04/22/2021   CHOLHDL 4.0 04/22/2021   VLDL 21 04/22/2021   LDLCALC 125 (H) 04/22/2021   LDLCALC 154 (H) 03/13/2009    See Psychiatric Specialty Exam and Suicide Risk Assessment completed by Attending Physician prior to discharge.  Discharge destination:  Other:  [facility]  Is patient on multiple antipsychotic therapies at discharge:  No   Has Patient had three or more failed trials of antipsychotic monotherapy by history:  No  Recommended Plan for Multiple Antipsychotic Therapies: NA  Discharge Instructions     Diet - low sodium heart healthy   Complete by: As directed    Increase activity slowly   Complete by: As directed       Allergies as of 05/03/2021   No Known Allergies      Medication List     STOP taking these medications  aspirin 81 MG EC tablet Replaced by: aspirin 81 MG chewable tablet       TAKE these medications      Indication  acetaminophen 325 MG tablet Commonly known as: TYLENOL Take 2 tablets (650 mg total) by mouth every 6 (six) hours as needed for mild pain (or Fever >/= 101).  Indication: Pain   albuterol 108 (90 Base) MCG/ACT inhaler Commonly known as: VENTOLIN HFA Inhale 2 puffs into the lungs every 6 (six) hours as needed for wheezing or shortness of breath.  Indication: Asthma   aspirin 81 MG chewable tablet Chew 1 tablet (81 mg total) by mouth daily. Start taking on: May 04, 2021 Replaces: aspirin 81 MG EC tablet  Indication: MI prophylaxis   atorvastatin 10 MG tablet Commonly known as: LIPITOR Take 1 tablet (10 mg total) by mouth daily.  Indication: High Amount of Fats in the Blood   busPIRone 5 MG tablet Commonly known as: BUSPAR Take 1 tablet (5 mg total) by mouth 2 (two) times daily.  Indication: Anxiety Disorder    DULoxetine 30 MG capsule Commonly known as: CYMBALTA Take 1 capsule (30 mg total) by mouth 2 (two) times daily.  Indication: Major Depressive Disorder   gabapentin 100 MG capsule Commonly known as: NEURONTIN Take 2 capsules (200 mg total) by mouth 2 (two) times daily. What changed: how much to take  Indication: anxiety and drug craving   hydrOXYzine 25 MG tablet Commonly known as: ATARAX/VISTARIL Take 1 tablet (25 mg total) by mouth 3 (three) times daily as needed for anxiety.  Indication: Feeling Anxious   lisinopril 20 MG tablet Commonly known as: ZESTRIL Take 1 tablet (20 mg total) by mouth daily.  Indication: High Blood Pressure Disorder   traZODone 50 MG tablet Commonly known as: DESYREL Take 1 tablet (50 mg total) by mouth at bedtime as needed for sleep.  Indication: Trouble Sleeping        Follow-up Information     [facility] Call.   Specialty: Addiction Medicine Why: A referral has been made for you to this facility.  Please call daily to check on the status of an available bed. Contact information: [address] [place] [address] [phone]         [facility] Call.   Why: A referral has been made for you. Contact information: [address] [place] [address] [phone]         [facility] Call.   Why: A referral has been made for you to this facility.  Please call daily to check on the status of an available bed. Contact information: [address] [place] [address] [phone]         [facility] Follow up on 06/07/2021.   Why: A primary care appointment has been made for you on 06/07/21 at 9:40am.  Please bring your insurance information and photo ID. Contact information: [address] [place] [address]  [phone] [employer] [employer] [phone])        [facility] Follow up on  06/08/2021.   Why: An intake appointment for therapy and medication management has been made for you on 06/08/21 at 10:30am.  Please bring your insurance information and photo ID. Contact information: [address], [place], [place] [address] [place] [phone]  [phone] [employer] [employer] [phone])                Follow-up recommendations:  Activity:  as tolerated Diet:  Heart Healthy  Comments:  Prescriptions were given at discharge.  Patient is agreeable with the discharge plan.  She was given opportunity to ask questions.  She appears to feel comfortable with discharge and denies any current suicidal or homicidal thoughts.   Patient is instructed prior to discharge to: Take all medications as prescribed by her mental healthcare provider. Report any adverse effects and or reactions from the medicines to her outpatient provider promptly. Patient has been instructed & cautioned: To not engage in alcohol and or illegal drug use while on prescription medicines. In the event of worsening symptoms, patient is instructed to call the crisis hotline, 911 and or go to the nearest ED for appropriate evaluation and treatment of symptoms. To follow-up with her primary care provider for your other medical issues, concerns and or health care needs.    Signed: Laveda Abbe, NP 05/04/2021, 9:08 AM

## 2021-05-03 NOTE — Progress Notes (Signed)
[facility] MD Progress Note  05/03/2021 4:23 PM Carrie Adkins  MRN:  [id number]  Reason for admission:  Carrie Adkins. Carrie Adkins is a 55 year old female with a history of polysubstance abuse (cocaine, marijuana, methamphetamine), depression and anxiety as well as multiple medical problems (COPD, hypertension, hyperlipidemia, diastolic CHF, history of MI, chronic pain, arthritis) who was admitted to [facility] from the inpatient medical unit at [facility] for further evaluation and treatment of depression and suicidal ideation.  The patient initially presented to the emergency department at [facility] on 04/21/2021 reporting multiple physical complaints including diffuse tingling, unsteady gait and vaginal discharge in the setting of recent relapse on crack cocaine.  Patient also made statements about not wanting to be a burden on others and planning to ingest pills.  In the ED, head CT was unremarkable, BAL was undetectable and urine tox screen was positive for amphetamines, cocaine and THC.  Objective: Medical record reviewed.  Patient's case discussed in detail with members of the treatment team and nursing staff.  I met with and evaluated the patient on the unit today for follow-up.  Patient looks better than she did last week.  She reports good mood and presents with stable appropriate affect.  She says she is worried about the possibility of having to leave the hospital prior to bed availability at residential substance abuse treatment program because she may have to go to a shelter wait for a bed.  Patient states that she knows that drugs are readily available near most shelters and she does not want to have that temptation nearby.  She says that her family may be willing to let her stay with them for a few days if residential substance abuse treatment program bed is not immediately available upon discharge from inpatient hospital.  She hopes to directly discharge from inpatient hospital to  residential substance abuse treatment program.  Patient denies depressed mood, anhedonia, passive wish for death, SI, AI, HI, AH, VH or PI.  She has been enjoying visits from family.  Patient says that she is eating and sleeping well.  She denies medication side effects.  Patient reports some continued bilateral hip and knee pain but denies any other physical issues.  Staff document that she slept 6.75 hours last night.  Vital signs at approximately 6:15am this morning include BP 163/101 sitting and 168/112 standing; pulse 73 sitting and 93 standing; O2 sat 100% on room air and temperature 98.2.  Repeat vital signs at approximately 8:30am this morning include BP of 156/88 and pulse of 80.  No new labs today.  Patient is taking scheduled medications as prescribed.  She took 3 doses of PRN hydroxyzine 25 mg yesterday and 1 dose of PRN hydroxyzine 25 mg this morning.  She took PRN trazodone 50 mg at 9:15pm last night for sleep.  Staff document the patient has been attending groups and participating appropriately.  She has been visible in the milieu and appropriately social with peers and staff.  Principal Problem: Major depressive disorder, recurrent episode, moderate (HCC) Diagnosis: Principal Problem:   Major depressive disorder, recurrent episode, moderate (HCC) Active Problems:   Polysubstance abuse (HCC)   Anxiety disorder, unspecified   Substance induced mood disorder (HCC)   Moderate cocaine use disorder (HCC)  Total Time spent with patient:  25 minutes  Past Psychiatric History: See admission H&P  Past Medical History:  Past Medical History:  Diagnosis Date   COPD without exacerbation ([place]) 03/01/2007   Qualifier: Diagnosis of  By: Marshell Levan MD, Curtis     Coronary artery disease involving native coronary artery of native heart 04/22/2021   Nicotine dependence, cigarettes, uncomplicated 01/19/3418   Polysubstance abuse ([place]) 04/22/2021   History reviewed. No pertinent surgical history. Family  History:  Family History  Problem Relation Age of Onset   Heart disease Neg Hx    Family Psychiatric  History: See admission H&P Social History:  Social History   Substance and Sexual Activity  Alcohol Use Not Currently     Social History   Substance and Sexual Activity  Drug Use Yes   Types: Cocaine, Methamphetamines, Marijuana    Social History   Socioeconomic History   Marital status: Legally Separated    Spouse name: Not on file   Number of children: Not on file   Years of education: Not on file   Highest education level: Not on file  Occupational History   Not on file  Tobacco Use   Smoking status: Every Day    Types: Cigarettes   Smokeless tobacco: Never  Vaping Use   Vaping Use: Never used  Substance and Sexual Activity   Alcohol use: Not Currently   Drug use: Yes    Types: Cocaine, Methamphetamines, Marijuana   Sexual activity: Yes    Birth control/protection: None  Other Topics Concern   Not on file  Social History Narrative   Not on file   Social Determinants of Health   Financial Resource Strain: Not on file  Food Insecurity: Not on file  Transportation Needs: Not on file  Physical Activity: Not on file  Stress: Not on file  Social Connections: Not on file   Additional Social History:                         Sleep: Good  Appetite:  Good  Current Medications: Current Facility-Administered Medications  Medication Dose Route Frequency Provider Last Rate Last Admin   acetaminophen (TYLENOL) tablet 650 mg  650 mg Oral Q6H PRN Suella Broad, FNP   650 mg at 05/03/21 1442   albuterol (VENTOLIN HFA) 108 (90 Base) MCG/ACT inhaler 2 puff  2 puff Inhalation Q6H PRN Arthor Captain, MD       alum & mag hydroxide-simeth (MAALOX/MYLANTA) 200-200-20 MG/5ML suspension 30 mL  30 mL Oral Q4H PRN Suella Broad, FNP       aspirin chewable tablet 81 mg  81 mg Oral Daily Arthor Captain, MD   81 mg at 05/03/21 1127   atorvastatin  (LIPITOR) tablet 10 mg  10 mg Oral Daily Suella Broad, FNP   10 mg at 05/03/21 0814   busPIRone (BUSPAR) tablet 5 mg  5 mg Oral BID Arthor Captain, MD   5 mg at 05/03/21 0813   DULoxetine (CYMBALTA) DR capsule 30 mg  30 mg Oral BID Arthor Captain, MD       gabapentin (NEURONTIN) capsule 200 mg  200 mg Oral BID Arthor Captain, MD   200 mg at 05/03/21 0814   hydrOXYzine (ATARAX/VISTARIL) tablet 25 mg  25 mg Oral TID PRN Arthor Captain, MD   25 mg at 05/03/21 0815   lisinopril (ZESTRIL) tablet 20 mg  20 mg Oral Daily Ethelene Hal, NP   20 mg at 05/03/21 0813   magnesium hydroxide (MILK OF MAGNESIA) suspension 30 mL  30 mL Oral Daily PRN Suella Broad, FNP       traZODone ([place])  tablet 50 mg  50 mg Oral QHS PRN Arthor Captain, MD   50 mg at 05/02/21 2116    Lab Results:  No results found for this or any previous visit (from the past 60 hour(s)).   Blood Alcohol level:  Lab Results  Component Value Date   ETH <10 69/62/9528    Metabolic Disorder Labs: Lab Results  Component Value Date   HGBA1C 5.4 04/22/2021   MPG 108.28 04/22/2021   No results found for: PROLACTIN Lab Results  Component Value Date   CHOL 194 04/22/2021   TRIG 106 04/22/2021   HDL 48 04/22/2021   CHOLHDL 4.0 04/22/2021   VLDL 21 04/22/2021   LDLCALC 125 (H) 04/22/2021   LDLCALC 154 (H) 03/13/2009    Physical Findings: AIMS:  , ,  ,  ,    CIWA:    COWS:     Musculoskeletal: Strength & Muscle Tone: within normal limits Gait & Station:  Slow steady gait due to discomfort from arthritis Patient leans: N/A  Psychiatric Specialty Exam:  Presentation  General Appearance: Appropriate for Environment  Eye Contact:Good  Speech:Clear and Coherent; Normal Rate  Speech Volume:Normal  Handedness:Right   Mood and Affect  Mood:Euthymic  Affect:Appropriate; Congruent   Thought Process  Thought Processes:Coherent; Goal Directed  Descriptions of  Associations:Intact  Orientation:Full (Time, Place and Person)  Thought Content:Logical; WDL  History of Schizophrenia/Schizoaffective disorder:No data recorded Duration of Psychotic Symptoms:No data recorded Hallucinations:Hallucinations: None  Ideas of Reference:None  Suicidal Thoughts:Suicidal Thoughts: No  Homicidal Thoughts:Homicidal Thoughts: No   Sensorium  Memory:Immediate Good; Recent Good; Remote Good  Judgment:Fair  Insight:Fair   Executive Functions  Concentration:Good  Attention Span:Good  [place] of Knowledge:Good  Language:Good   Psychomotor Activity  Psychomotor Activity:Psychomotor Activity: Normal   Assets  Assets:Communication Skills; Desire for Improvement; Financial Resources/Insurance; Resilience; Social Support   Sleep  Sleep:Sleep: Good Number of Hours of Sleep: 6.75    Physical Exam: Physical Exam Vitals and nursing note reviewed.  Constitutional:      General: She is not in acute distress.    Appearance: Normal appearance. She is not diaphoretic.  HENT:     Head: Normocephalic and atraumatic.  Cardiovascular:     Rate and Rhythm: Normal rate.  Pulmonary:     Effort: Pulmonary effort is normal.  Neurological:     General: No focal deficit present.     Mental Status: She is alert and oriented to person, place, and time.   Review of Systems  Constitutional:  Negative for chills, diaphoresis and fever.  HENT:  Negative for sore throat.   Respiratory:  Negative for cough and shortness of breath.   Cardiovascular:  Negative for chest pain and palpitations.  Gastrointestinal:  Negative for constipation, diarrhea, nausea and vomiting.  Genitourinary: Negative.   Musculoskeletal:  Positive for joint pain. Negative for myalgias.       Positive for chronic bilateral hip pain and chronic bilateral knee pain  Neurological:  Negative for dizziness, tremors and seizures.       Positive for chronic unilateral numbness of  right side of face  Psychiatric/Behavioral:  Positive for substance abuse. Negative for depression, hallucinations and suicidal ideas. The patient is not nervous/anxious and does not have insomnia.   All other systems reviewed and are negative. Blood pressure (!) 156/88, pulse 80, temperature 98.2 F (36.8 C), temperature source Oral, resp. rate 16, height _0  (1.676 m), weight 80.7 kg, SpO2 100 %. Body mass index  is 28.73 kg/m.   Treatment Plan Summary: The patient is a 55 year old female with multiple medical problems and prior history of depression, anxiety and substance abuse who was admitted after she reported worsening symptoms of depression and suicidal ideation in the context of recent relapse on cocaine.  Patient is tolerating initiation of duloxetine and buspirone which she has taken in the past.  She is improving and continues to deny depressed mood or suicidal ideation.  She is eating and sleeping well.  Patient reports desire to go to residential substance abuse treatment program after discharge and social work is investigating residential substance abuse treatment options for patient.  Goal is for discharge from inpatient setting directly to bed at residential substance abuse treatment program.  Patient appears to have a bed available at a program in [place].  Anticipate probable discharge within the next 24 hours.  Daily contact with patient to assess and evaluate symptoms and progress in treatment and Medication management  Major depressive disorder, recurrent, without psychotic features -Increase duloxetine to 30 mg twice daily for mood and anxiety.   -Continue buspirone 5 mg twice daily for adjunctive treatment of mood and anxiety.  Anxiety disorder, unspecified -Increase duloxetine to 30 mg twice daily for mood and anxiety.   -Continue buspirone 5 mg twice daily for adjunctive treatment of mood and anxiety. -Discontinue gabapentin 200 mg twice daily since patient will be  unable to take it at her residential substance abuse treatment program since it is not allowed at the substance abuse treatment program she plans to attend.   -Continue hydroxyzine 25 mg 3 times daily PRN  Insomnia -Continue trazodone 50 mg nightly PRN  Substance use disorder (cocaine) -I have discussed with patient the risks of substance use and advised cessation of alcohol and drug use. -I have advised patient to participate in substance abuse treatment program after discharge.  Patient is receptive to participating in residential substance abuse treatment program.  Social work has been investigating options for patient.  Abnormal MRI of the brain -MRI of the brain showed no acute intracranial pathology.  There was a 3.9 cm solid lesion centered around the right aspect of the clivus/sella that likely reflects a meningioma.  MRI also showed encasement of the cavernous ICA.  Repeat postcontrast imaging of the brain for better delineation of vascular involvement on a nonemergent outpatient basis was recommended.   Chronic facial numbness -Patient to follow-up as outpatient with PCP and neurology  Hypertension -Continue lisinopril 20 mg daily  Elevated cholesterol -Continue Lipitor 10 mg daily  Chronic pain -Discontinue gabapentin 200 mg twice daily since patient will be unable to take it at her residential substance abuse treatment program since it is not allowed at the substance abuse treatment program she plans to attend.   -Continue acetaminophen 650 mg Q6H PRN  COPD -Continue albuterol inhaler Q6H PRN  Discharge planning in progress.   Patient will need to follow-up with her PCP regarding multiple medical conditions.  She will need outpatient mental health therapy and psychiatric med management follow-up.  Social work is investigating possible substance abuse treatment programs for patient.  Anticipate probable discharge tomorrow to [facility].     Arthor Captain,  MD 05/03/2021, 4:23 PM
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
## 2021-05-04 NOTE — Progress Notes (Signed)
   05/03/21 2055  Psych Admission Type (Psych Patients Only)  Admission Status Voluntary  Psychosocial Assessment  Patient Complaints None  Eye Contact Fair  Facial Expression Anxious  Affect Appropriate to circumstance  Speech Logical/coherent  Interaction Assertive  Motor Activity Slow  Appearance/Hygiene In scrubs  Behavior Characteristics Cooperative  Mood Pleasant  Thought Process  Coherency WDL  Content WDL  Delusions None reported or observed  Perception WDL  Hallucination None reported or observed  Judgment WDL  Confusion None  Danger to Self  Current suicidal ideation? Denies  Danger to Others  Danger to Others None reported or observed

## 2021-05-04 NOTE — Progress Notes (Signed)
   05/04/21 0610  Vital Signs  Temp 98.4 F (36.9 C)  Temp Source Oral  Pulse Rate 69  BP (!) 159/99  BP Location Right Arm  BP Method Automatic  Patient Position (if appropriate) Sitting  Oxygen Therapy  SpO2 100 %   D: Patient denies SI/HI/AVH. Patient rated anxiety 5/10 and denied depression. Pt. Out in open areas, smiling and social with peers. A:  Patient took scheduled medicine.  Support and encouragement provided Routine safety checks conducted every 15 minutes. Patient  Informed to notify staff with any concerns.   R:  Safety maintained.

## 2021-05-04 NOTE — Progress Notes (Signed)
  [facility] Adult Case Management Discharge Plan :  Will you be returning to the same living situation after discharge:  No.  [facility]  At discharge, do you have transportation home?: Yes,  Safe Transport  Do you have the ability to pay for your medications: Yes,  Medicaid   Release of information consent forms completed and in the chart;  Patient's signature needed at discharge.  Patient to Follow up at:  Follow-up Information     [facility] Call.   Specialty: Addiction Medicine Why: A referral has been made for you to this facility.  Please call daily to check on the status of an available bed. Contact information: [address] [place] [address] [phone]         [facility] Call.   Why: You will be attending this facility on 05/04/21 at 1:00pm. Contact information: [address] [place]  [address] [phone]         [facility] Follow up on 06/07/2021.   Why: A primary care appointment has been made for you on 06/07/21 at 9:40am.  Please bring your insurance information and photo ID. Contact information: [address] [place] [address]  [phone] [facility] [phone])        [facility] Follow up on 06/08/2021.   Why: An intake appointment for therapy and medication management has been made for you on 06/08/21 at 10:30am.  Please bring your insurance information and photo ID. Contact information: [address] [place], [place] [address] [place] [phone]  [phone] [facility] [phone])                Next level of care provider has access to [facility] Link:no  Safety Planning and Suicide Prevention discussed: Yes,  Sister      Has patient been referred to the Quitline?: Patient refused referral  Patient has been referred for addiction treatment: Yes  Aram Beecham,  LCSWA 05/04/2021, 9:31 AM

## 2021-05-24 ENCOUNTER — Encounter (HOSPITAL_COMMUNITY): Payer: Self-pay | Admitting: Radiology

## 2023-06-14 IMAGING — MR MR HEAD W/O CM
10 series · 48 of 48 positions shown · non-contrast
Comparison: CT head dated 1 day prior

CLINICAL DATA: Weakness, unsteady gait

EXAM:
MRI HEAD WITHOUT CONTRAST
TECHNIQUE: Multiplanar, multiecho pulse sequences of the brain and surrounding
structures were obtained without intravenous contrast.

[Series 5: DWI · axial · 3.0mm · 1.36mm/px · z∈[-41,+116]mm · 9 of 107 slices shown (1 of 2)]
[im 1/107]
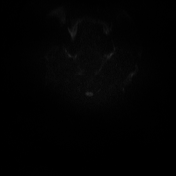
[im 14/107]
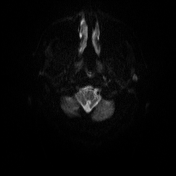
[im 27/107]
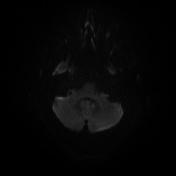
[im 40/107]
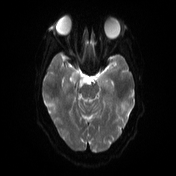
[im 54/107]
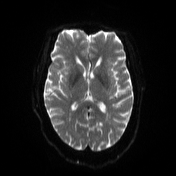
[im 67/107]
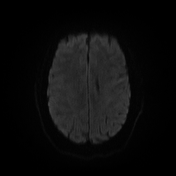
[im 80/107]
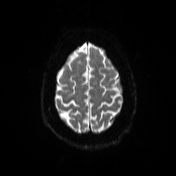
[im 93/107]
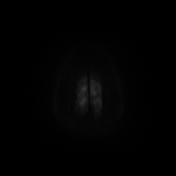
[im 107/107]
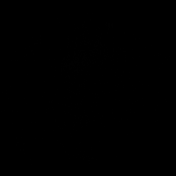

[Series 6: DWI · axial · 3.0mm · 1.36mm/px · z∈[-41,+110]mm · 4 of 52 slices shown (2 of 2)]
[im 1/52]
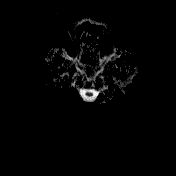
[im 18/52]
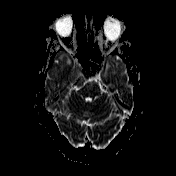
[im 35/52]
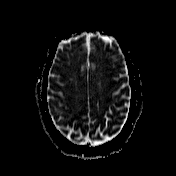
[im 52/52]
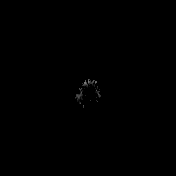

[Series 7: T1 · sagittal · 5.0mm · 0.75mm/px · 2 of 24 slices shown (1 of 2)]
[im 1/24]
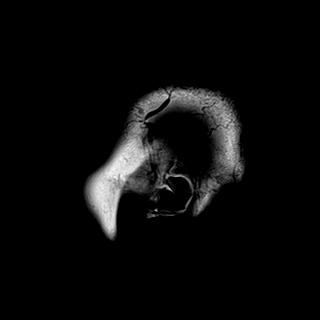
[im 24/24]
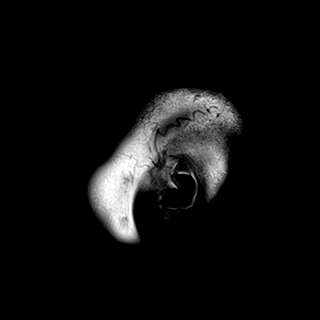

[Series 8: T2 · axial · 5.0mm · 0.62mm/px · z∈[-42,+120]mm · 2 of 26 slices shown (1 of 2)]
[im 1/26]
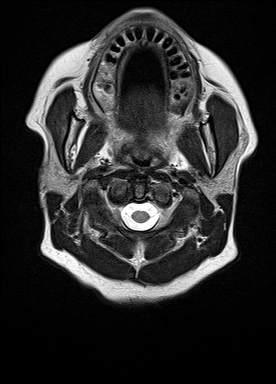
[im 26/26]
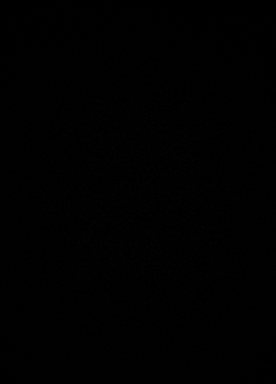

[Series 9: swi_images · axial · 3.0mm · 0.75mm/px · z∈[-43,+121]mm · 5 of 56 slices shown]
[im 1/56]
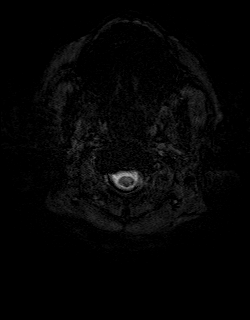
[im 14/56]
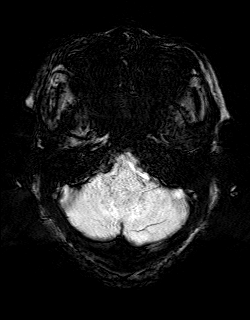
[im 28/56]
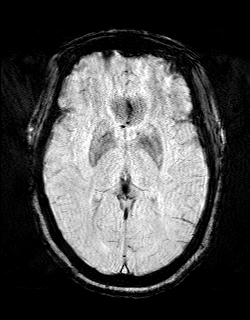
[im 42/56]
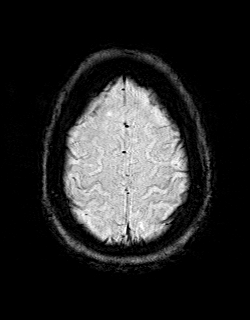
[im 56/56]
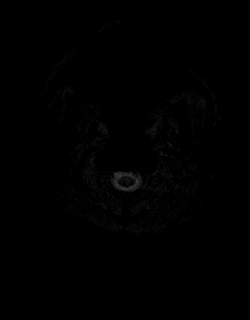

[Series 11: FLAIR · axial · 3.0mm · 0.75mm/px · z∈[-37,+115]mm · 4 of 52 slices shown]
[im 1/52]
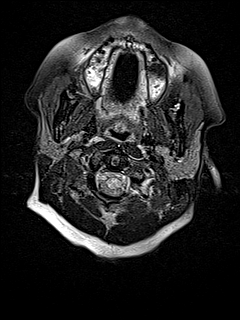
[im 18/52]
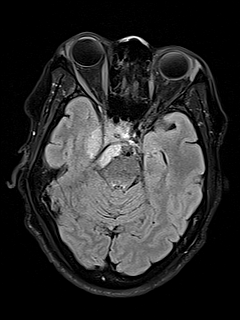
[im 35/52]
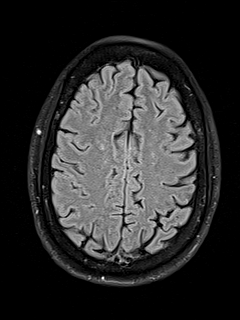
[im 52/52]
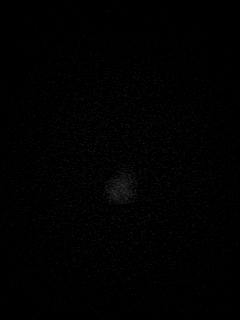

[Series 12: T1 · axial · 1.0mm · 0.94mm/px · z∈[-44,+114]mm · 13 of 160 slices shown (2 of 2)]
[im 1/160]
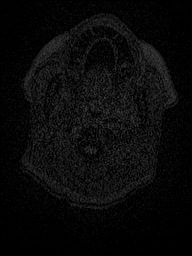
[im 14/160]
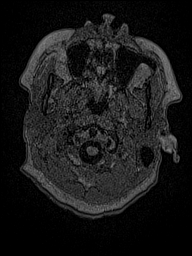
[im 27/160]
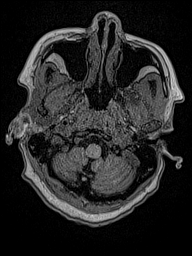
[im 40/160]
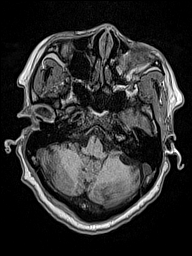
[im 54/160]
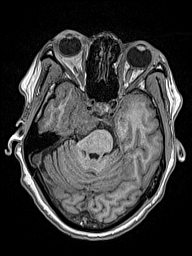
[im 67/160]
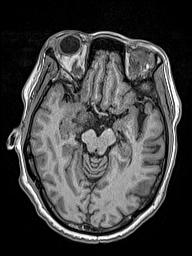
[im 80/160]
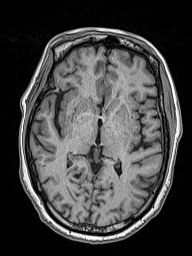
[im 93/160]
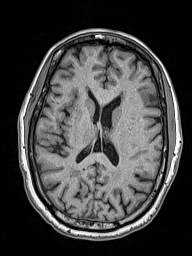
[im 107/160]
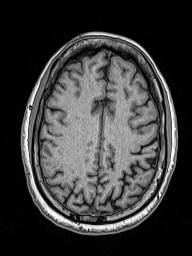
[im 120/160]
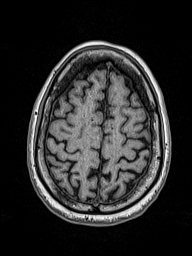
[im 133/160]
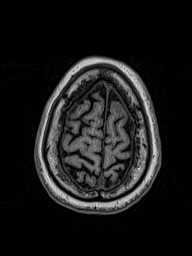
[im 146/160]
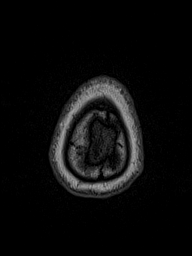
[im 160/160]
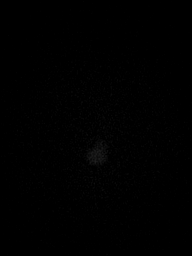

[Series 13: cor dwi_tracew · coronal · 5.0mm · 1.53mm/px · 4 of 52 slices shown]
[im 1/52]
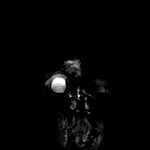
[im 18/52]
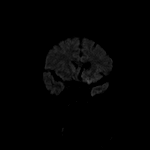
[im 35/52]
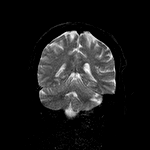
[im 52/52]
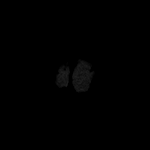

[Series 14: cor dwi_adc · coronal · 5.0mm · 1.53mm/px · 2 of 25 slices shown]
[im 1/25]
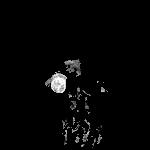
[im 25/25]
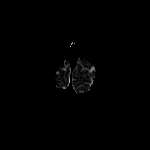

[Series 15: T2 · coronal · 5.0mm · 0.69mm/px · 3 of 36 slices shown (2 of 2)]
[im 1/36]
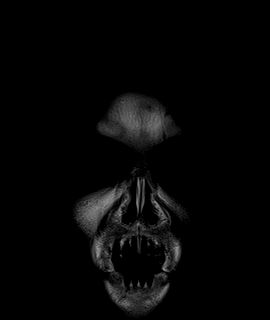
[im 18/36]
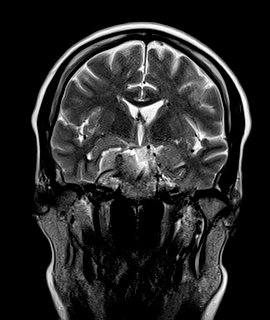
[im 36/36]
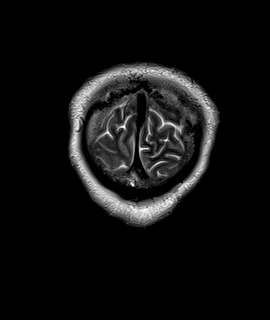

[48 of 48 positions shown; findings below may reference images not displayed]

FINDINGS: Brain: There is no evidence of acute intracranial hemorrhage,
extra-axial fluid collection, or infarct.

Scattered foci of FLAIR signal abnormality throughout the
subcortical and periventricular white matter likely reflects sequela
of chronic white matter microangiopathy. There is no suspicious
parenchymal signal abnormality. The ventricles are not enlarged.
There is no midline shift.

There is a 3.9 cm AP by 2.8 cm TV by 2.2 cm cc T2 hypointense lesion
centered around the right aspect of the clivus/sella likely
reflecting a meningioma. There is encasement of the cavernous ICA
and presumed cavernous sinus and sellar invasion. The lesion may
extend to the foramen rotundum. The lesion extends to but does not
definitely extend through the foramen ovale. There is mild mass
effect on the pons without underlying parenchymal signal
abnormality.

Vascular: The right ICA flow void is mildly narrowed due to
encasement by the above described lesion. The left ICA flow void is
patent.

Skull and upper cervical spine: Normal marrow signal.

Sinuses/Orbits: The paranasal sinuses are clear. The globes and
orbits are unremarkable.

Other: None.
IMPRESSION: 1. No acute intracranial pathology.
2. 3.9 cm solid lesion centered around the right aspect of the
clivus/sella likely reflects a meningioma. There is encasement of
the cavernous ICA. Recommend post-contrast imaging of the brain for
better delineation of vascular involvement. This can be obtained on
a nonemergent outpatient basis.

## 2023-08-23 ENCOUNTER — Other Ambulatory Visit (HOSPITAL_COMMUNITY): Payer: Self-pay

## 2023-08-23 ENCOUNTER — Other Ambulatory Visit: Payer: Self-pay | Admitting: Student

## 2023-08-23 ENCOUNTER — Encounter: Payer: Self-pay | Admitting: Physician Assistant

## 2023-08-23 ENCOUNTER — Ambulatory Visit: Payer: Medicaid Other | Admitting: Physician Assistant

## 2023-08-23 DIAGNOSIS — I10 Essential (primary) hypertension: Secondary | ICD-10-CM

## 2023-08-23 MED ORDER — LISINOPRIL-HYDROCHLOROTHIAZIDE 20-12.5 MG PO TABS
1.0000 | ORAL_TABLET | Freq: Every day | ORAL | 0 refills | Status: DC
  Filled 2023-08-23: qty 30, 30d supply, fill #0

## 2023-08-23 NOTE — Progress Notes (Signed)
 Pt c/o dizziness  186/97 in R arm, 190/106 L arm  Today's Vitals   08/23/23 1700  BP: (!) 190/106  Pulse: 76  SpO2: 98%   There is no height or weight on file to calculate BMI.  She is having pain in her knees, L one is swelling. She has other joints that are painful, has been on gabapentin  in the past (unable to document in EPIC or Care Everywhere). Takes Tylenol , 1-1.5 gm at a time.   Crack cocaine, last on 12/24 Smokes, denies ETOH  Had elevated troponin in 2020, clean cath per pt.   She is having HAs, feels this is from her BP. Will restart the Lisinopril  at 20 mg with hydrochlorothiazide  12.5 mg every day. She has Medicaid, will call her dtr or sister to help w/ copay  She will need O.P. f/u, the importance of keeping f/u appts was emphasized.   She does not have a working phone.  Best is to call her Daughter, Camelia Brandt, @  [phone]  If you can't get her dtr, call her son Derricks Spinks @ [phone].   Contact list updated.    Shona Shad, PA-C 08/23/2023 5:19 PM

## 2023-08-23 NOTE — Progress Notes (Signed)
 Sent Rx for lisinopril/HCTZ

## 2023-08-24 ENCOUNTER — Other Ambulatory Visit (HOSPITAL_COMMUNITY): Payer: Self-pay

## 2023-08-31 NOTE — Congregational Nurse Program (Signed)
  Dept: [phone]   Congregational Nurse Program Note  Date of Encounter: 08/31/2023  Clinic visit to check blood pressure after having been on Zestoretic 12.5 sine 08/24/2023.  BP 194/101, pulse 80 and regular, O2 Sat 100%.  Repeat BP 15 minutes later 201/104, referred to [facility].  Resident called her Daughter to provide transportation. Past Medical History: Past Medical History:  Diagnosis Date   COPD without exacerbation (HCC) 03/01/2007   Qualifier: Diagnosis of  By: Beverely Pace MD, Curtis     Coronary artery disease involving native coronary artery of native heart 04/22/2021   Nicotine dependence, cigarettes, uncomplicated 04/22/2021   Polysubstance abuse (HCC) 04/22/2021    Encounter Details:  Community Questionnaire - 08/31/23 1455       Questionnaire   Ask client: Do you give verbal consent for me to treat you today? Yes    Student Assistance N/A    Location Patient Served  [facility]    Encounter Setting CN site    Population Status Unhoused    Insurance Medicaid    Insurance/Financial Assistance Referral N/A    Medication Have Medication Insecurities    Medical Provider No    Screening Referrals Made N/A    Medical Referrals Made Urgent Care    Medical Appointment Completed N/A    CNP Interventions Advocate/Support;Counsel;Educate    Screenings CN Performed Blood Pressure    ED Visit Averted Yes    Life-Saving Intervention Made N/A

## 2023-09-06 ENCOUNTER — Encounter: Payer: Self-pay | Admitting: Critical Care Medicine

## 2023-09-07 NOTE — Progress Notes (Signed)
This patient a 58 year old female seen in the [place] clinic today complains of increased anxiety previously on BuSpar chronic degeneration of both knees needing total knee replacement but has not been able to achieve this prior total hip replacements in the past.  She has been in the [place] area for 5 months and has been at the shelter about 3 weeks.  She already has an established primary care appointment with myself on 12 February at 11:10 AM she does not have a phone.  She complains of vaginal discharge and ongoing generalized pain has to walk with the use of a cane.  Prior history of falling out and being weak on the left side evaluation that occurred in the emergency room in 2022 showed a meningioma in the suprasellar area of the right side of the brain wrapping around the right internal carotid artery and all of her symptoms on the left side.  We will get this patient to primary care she will need a repeat MRI with contrast with full set of labs evaluation of vaginal discharge and likely orthopedic referral for her degenerative joint disease in the interim we gave her Tylenol and ibuprofen to take in alternating fashion

## 2023-09-13 ENCOUNTER — Other Ambulatory Visit: Payer: Self-pay | Admitting: Critical Care Medicine

## 2023-09-13 ENCOUNTER — Other Ambulatory Visit (HOSPITAL_COMMUNITY): Payer: Self-pay

## 2023-09-13 DIAGNOSIS — I10 Essential (primary) hypertension: Secondary | ICD-10-CM

## 2023-09-13 MED ORDER — LISINOPRIL-HYDROCHLOROTHIAZIDE 20-12.5 MG PO TABS
1.0000 | ORAL_TABLET | Freq: Every day | ORAL | 0 refills | Status: DC
  Filled 2023-09-13 – 2023-10-13 (×2): qty 60, 60d supply, fill #0

## 2023-09-13 NOTE — Progress Notes (Signed)
rf

## 2023-09-25 ENCOUNTER — Other Ambulatory Visit (HOSPITAL_COMMUNITY): Payer: Self-pay

## 2023-09-25 NOTE — Progress Notes (Deleted)
 New Patient Office Visit  Subjective    Patient ID: Carrie Adkins, female    DOB: 05/24/1966  Age: 58 y.o. MRN: [id number]  CC: No chief complaint on file.   HPI Carrie Adkins presents to establish care [facility] resident here to est PCP From 08/2022 Shelter clinic:  This patient a 58 year old female seen in the [place] clinic today complains of increased anxiety previously on BuSpar chronic degeneration of both knees needing total knee replacement but has not been able to achieve this prior total hip replacements in the past.  She has been in the [place] area for 5 months and has been at the shelter about 3 weeks.  She already has an established primary care appointment with myself on 12 February at 11:10 AM she does not have a phone.   She complains of vaginal discharge and ongoing generalized pain has to walk with the use of a cane.  Prior history of falling out and being weak on the left side evaluation that occurred in the emergency room in 2022 showed a meningioma in the suprasellar area of the right side of the brain wrapping around the right internal carotid artery and all of her symptoms on the left side.   We will get this patient to primary care she will need a repeat MRI with contrast with full set of labs evaluation of vaginal discharge and likely orthopedic referral for her degenerative joint disease in the interim we gave her Tylenol and ibuprofen to take in alternating fashion       Outpatient Encounter Medications as of 09/27/2023  Medication Sig   acetaminophen (TYLENOL) 325 MG tablet Take 2 tablets (650 mg total) by mouth every 6 (six) hours as needed for mild pain (or Fever >/= 101).   albuterol (VENTOLIN HFA) 108 (90 Base) MCG/ACT inhaler Inhale 2 puffs into the lungs every 6 (six) hours as needed for wheezing or shortness of breath.   aspirin 81 MG chewable tablet Chew 1 tablet (81 mg total) by mouth daily.   atorvastatin (LIPITOR) 10 MG  tablet Take 1 tablet (10 mg total) by mouth daily.   busPIRone (BUSPAR) 5 MG tablet Take 1 tablet (5 mg total) by mouth 2 (two) times daily.   hydrOXYzine (ATARAX/VISTARIL) 25 MG tablet Take 1 tablet (25 mg total) by mouth 3 (three) times daily as needed for anxiety.   lisinopril-hydrochlorothiazide (ZESTORETIC) 20-12.5 MG tablet Take 1 tablet by mouth daily.   traZODone (DESYREL) 50 MG tablet Take 1 tablet (50 mg total) by mouth at bedtime as needed for sleep.   No facility-administered encounter medications on file as of 09/27/2023.    Past Medical History:  Diagnosis Date   COPD without exacerbation (HCC) 03/01/2007   Qualifier: Diagnosis of  By: Beverely Pace MD, Curtis     Coronary artery disease involving native coronary artery of native heart 04/22/2021   Nicotine dependence, cigarettes, uncomplicated 04/22/2021   Polysubstance abuse (HCC) 04/22/2021    No past surgical history on file.  Family History  Problem Relation Age of Onset   Heart disease Neg Hx     Social History   Socioeconomic History   Marital status: Legally Separated    Spouse name: Not on file   Number of children: Not on file   Years of education: Not on file   Highest education level: Not on file  Occupational History   Not on file  Tobacco Use   Smoking status: Every Day  Types: Cigarettes   Smokeless tobacco: Never  Vaping Use   Vaping status: Never Used  Substance and Sexual Activity   Alcohol use: Not Currently   Drug use: Yes    Types: Cocaine, Methamphetamines, Marijuana   Sexual activity: Yes    Birth control/protection: None  Other Topics Concern   Not on file  Social History Narrative   Not on file   Social Drivers of Health   Financial Resource Strain: Not on file  Food Insecurity: Not on file  Transportation Needs: Not on file  Physical Activity: Not on file  Stress: Not on file  Social Connections: Not on file  Intimate Partner Violence: Not on file    ROS      Objective     There were no vitals taken for this visit.  Physical Exam  {Labs (Optional):23779}    Assessment & Plan:   Problem List Items Addressed This Visit   None   No follow-ups on file.   Shan Levans, MD

## 2023-09-27 ENCOUNTER — Ambulatory Visit: Payer: Medicaid Other | Admitting: Critical Care Medicine

## 2023-10-12 ENCOUNTER — Ambulatory Visit: Payer: Self-pay | Admitting: Family Medicine

## 2023-10-13 ENCOUNTER — Other Ambulatory Visit (HOSPITAL_COMMUNITY): Payer: Self-pay

## 2023-10-16 ENCOUNTER — Other Ambulatory Visit (HOSPITAL_COMMUNITY): Payer: Self-pay

## 2023-11-17 ENCOUNTER — Ambulatory Visit: Payer: Self-pay | Admitting: Family Medicine

## 2024-05-05 ENCOUNTER — Inpatient Hospital Stay (HOSPITAL_COMMUNITY)
Admission: EM | Admit: 2024-05-05 | Discharge: 2024-05-15 | DRG: 683 | Disposition: A | Discharge status: Rehabilitation Facility | Payer: MEDICAID | Attending: Internal Medicine | Admitting: Internal Medicine

## 2024-05-05 ENCOUNTER — Emergency Department (HOSPITAL_COMMUNITY): Payer: MEDICAID

## 2024-05-05 ENCOUNTER — Encounter (HOSPITAL_COMMUNITY): Payer: Self-pay | Admitting: Emergency Medicine

## 2024-05-05 DIAGNOSIS — F1910 Other psychoactive substance abuse, uncomplicated: Secondary | ICD-10-CM | POA: Diagnosis present

## 2024-05-05 DIAGNOSIS — I130 Hypertensive heart and chronic kidney disease with heart failure and stage 1 through stage 4 chronic kidney disease, or unspecified chronic kidney disease: Secondary | ICD-10-CM | POA: Diagnosis present

## 2024-05-05 DIAGNOSIS — I5032 Chronic diastolic (congestive) heart failure: Secondary | ICD-10-CM | POA: Diagnosis present

## 2024-05-05 DIAGNOSIS — Z638 Other specified problems related to primary support group: Secondary | ICD-10-CM

## 2024-05-05 DIAGNOSIS — E871 Hypo-osmolality and hyponatremia: Secondary | ICD-10-CM | POA: Diagnosis present

## 2024-05-05 DIAGNOSIS — G8929 Other chronic pain: Secondary | ICD-10-CM | POA: Diagnosis present

## 2024-05-05 DIAGNOSIS — F17200 Nicotine dependence, unspecified, uncomplicated: Secondary | ICD-10-CM | POA: Diagnosis present

## 2024-05-05 DIAGNOSIS — N1831 Chronic kidney disease, stage 3a: Secondary | ICD-10-CM | POA: Diagnosis present

## 2024-05-05 DIAGNOSIS — F1210 Cannabis abuse, uncomplicated: Secondary | ICD-10-CM | POA: Diagnosis present

## 2024-05-05 DIAGNOSIS — Z7982 Long term (current) use of aspirin: Secondary | ICD-10-CM | POA: Diagnosis not present

## 2024-05-05 DIAGNOSIS — R079 Chest pain, unspecified: Secondary | ICD-10-CM | POA: Diagnosis present

## 2024-05-05 DIAGNOSIS — F17210 Nicotine dependence, cigarettes, uncomplicated: Secondary | ICD-10-CM | POA: Diagnosis present

## 2024-05-05 DIAGNOSIS — F419 Anxiety disorder, unspecified: Secondary | ICD-10-CM | POA: Diagnosis present

## 2024-05-05 DIAGNOSIS — F331 Major depressive disorder, recurrent, moderate: Secondary | ICD-10-CM | POA: Diagnosis present

## 2024-05-05 DIAGNOSIS — E785 Hyperlipidemia, unspecified: Secondary | ICD-10-CM | POA: Diagnosis present

## 2024-05-05 DIAGNOSIS — Z5941 Food insecurity: Secondary | ICD-10-CM | POA: Diagnosis not present

## 2024-05-05 DIAGNOSIS — Z79899 Other long term (current) drug therapy: Secondary | ICD-10-CM

## 2024-05-05 DIAGNOSIS — E860 Dehydration: Secondary | ICD-10-CM | POA: Diagnosis present

## 2024-05-05 DIAGNOSIS — J449 Chronic obstructive pulmonary disease, unspecified: Secondary | ICD-10-CM | POA: Diagnosis present

## 2024-05-05 DIAGNOSIS — K219 Gastro-esophageal reflux disease without esophagitis: Secondary | ICD-10-CM | POA: Diagnosis present

## 2024-05-05 DIAGNOSIS — Z5900 Homelessness unspecified: Secondary | ICD-10-CM

## 2024-05-05 DIAGNOSIS — E876 Hypokalemia: Secondary | ICD-10-CM | POA: Diagnosis present

## 2024-05-05 DIAGNOSIS — I2510 Atherosclerotic heart disease of native coronary artery without angina pectoris: Secondary | ICD-10-CM | POA: Diagnosis present

## 2024-05-05 DIAGNOSIS — F1420 Cocaine dependence, uncomplicated: Secondary | ICD-10-CM | POA: Diagnosis present

## 2024-05-05 DIAGNOSIS — R45851 Suicidal ideations: Secondary | ICD-10-CM | POA: Diagnosis present

## 2024-05-05 DIAGNOSIS — I252 Old myocardial infarction: Secondary | ICD-10-CM | POA: Diagnosis not present

## 2024-05-05 DIAGNOSIS — N179 Acute kidney failure, unspecified: Secondary | ICD-10-CM | POA: Diagnosis present

## 2024-05-05 DIAGNOSIS — Z888 Allergy status to other drugs, medicaments and biological substances status: Secondary | ICD-10-CM

## 2024-05-05 DIAGNOSIS — Z7151 Drug abuse counseling and surveillance of drug abuser: Secondary | ICD-10-CM

## 2024-05-05 DIAGNOSIS — Z751 Person awaiting admission to adequate facility elsewhere: Secondary | ICD-10-CM

## 2024-05-05 LAB — BASIC METABOLIC PANEL WITH GFR
Anion gap: 18 — ABNORMAL HIGH (ref 5–15)
BUN: 82 mg/dL — ABNORMAL HIGH (ref 6–20)
CO2: 18 mmol/L — ABNORMAL LOW (ref 22–32)
Calcium: 9.4 mg/dL (ref 8.9–10.3)
Chloride: 98 mmol/L (ref 98–111)
Creatinine, Ser: 5.6 mg/dL — ABNORMAL HIGH (ref 0.44–1.00)
GFR, Estimated: 8 mL/min — ABNORMAL LOW (ref >60)
Glucose, Bld: 101 mg/dL — ABNORMAL HIGH (ref 70–99)
Potassium: 3.2 mmol/L — ABNORMAL LOW (ref 3.5–5.1)
Sodium: 134 mmol/L — ABNORMAL LOW (ref 135–145)

## 2024-05-05 LAB — CK: Total CK: 242 U/L — ABNORMAL HIGH (ref 38–234)

## 2024-05-05 LAB — CBC
HCT: 47.1 % — ABNORMAL HIGH (ref 36.0–46.0)
Hemoglobin: 15.9 g/dL — ABNORMAL HIGH (ref 12.0–15.0)
MCH: 29.3 pg (ref 26.0–34.0)
MCHC: 33.8 g/dL (ref 30.0–36.0)
MCV: 86.9 fL (ref 80.0–100.0)
Platelets: 494 K/uL — ABNORMAL HIGH (ref 150–400)
RBC: 5.42 MIL/uL — ABNORMAL HIGH (ref 3.87–5.11)
RDW: 13.6 % (ref 11.5–15.5)
WBC: 7.9 K/uL (ref 4.0–10.5)
nRBC: 0 % (ref 0.0–0.2)

## 2024-05-05 LAB — TROPONIN I (HIGH SENSITIVITY)
Troponin I (High Sensitivity): 28 ng/L — ABNORMAL HIGH (ref <18)
Troponin I (High Sensitivity): 31 ng/L — ABNORMAL HIGH (ref <18)

## 2024-05-05 MED ORDER — TRAZODONE HCL 50 MG PO TABS: 50.0000 mg | ORAL_TABLET | Freq: Every evening | ORAL | Status: DC | PRN

## 2024-05-05 MED ORDER — HYDROCHLOROTHIAZIDE 12.5 MG PO TABS: 12.5000 mg | ORAL_TABLET | Freq: Every day | ORAL | Status: DC

## 2024-05-05 MED ORDER — SODIUM CHLORIDE 0.9 % IV BOLUS
1000.0000 mL | Freq: Once | INTRAVENOUS | Status: AC
  Administered 2024-05-05: 1000 mL via INTRAVENOUS

## 2024-05-05 MED ORDER — LISINOPRIL 20 MG PO TABS: 20.0000 mg | ORAL_TABLET | Freq: Every day | ORAL | Status: DC

## 2024-05-05 MED ORDER — BUSPIRONE HCL 10 MG PO TABS
5.0000 mg | ORAL_TABLET | Freq: Two times a day (BID) | ORAL | Status: DC
  Filled 2024-05-05: qty 1

## 2024-05-05 MED ORDER — HYDROXYZINE HCL 25 MG PO TABS: 25.0000 mg | ORAL_TABLET | Freq: Three times a day (TID) | ORAL | Status: DC | PRN

## 2024-05-05 MED ORDER — ASPIRIN 81 MG PO CHEW
324.0000 mg | CHEWABLE_TABLET | Freq: Once | ORAL | Status: AC
  Administered 2024-05-05: 324 mg via ORAL
  Filled 2024-05-05: qty 4

## 2024-05-05 MED ORDER — ATORVASTATIN CALCIUM 10 MG PO TABS
10.0000 mg | ORAL_TABLET | Freq: Every day | ORAL | Status: DC
  Filled 2024-05-05: qty 1

## 2024-05-05 MED ORDER — LISINOPRIL-HYDROCHLOROTHIAZIDE 20-12.5 MG PO TABS: 1.0000 | ORAL_TABLET | Freq: Every day | ORAL | Status: DC

## 2024-05-05 MED ORDER — ASPIRIN 81 MG PO CHEW: 81.0000 mg | CHEWABLE_TABLET | Freq: Every day | ORAL | Status: DC

## 2024-05-05 NOTE — ED Notes (Signed)
 Pt delcines needing to use the bathroom. Instructed pt to alert staff when she needed to go as we need a urine sample. Call light within reach. Water provided

## 2024-05-05 NOTE — ED Provider Notes (Signed)
 [facility] Provider Note   CSN: [id number] Arrival date & time: 05/05/24  1946     Patient presents with: Chest Pain, Homeless, and Suicidal   Carrie Adkins is a 58 y.o. female.  She is here with a complaint of pain in her chest intermittently for the last few days.  She said she has been living in an abandoned building for the last few weeks.  Intermittently staying with family.  Very depressed.  Ongoing drug use.  She said she has a history of heart attack in the past.  Wants to talk to a mental health professional.  Occasional shortness of breath.  No nausea or vomiting.  {Add pertinent medical, surgical, social history, OB history to YEP:67052} The history is provided by the patient.  Chest Pain Pain location:  Substernal area Pain quality: aching   Pain radiates to:  Does not radiate Pain severity:  Moderate Onset quality:  Gradual Duration:  1 week Timing:  Intermittent Progression:  Unchanged Chronicity:  New Context: stress   Relieved by:  Nothing Associated symptoms: anxiety and shortness of breath   Associated symptoms: no cough, no diaphoresis, no fever, no nausea and no vomiting   Risk factors: smoking        Prior to Admission medications   Medication Sig Start Date End Date Taking? Authorizing Provider  acetaminophen  (TYLENOL ) 325 MG tablet Take 2 tablets (650 mg total) by mouth every 6 (six) hours as needed for mild pain (or Fever >/= 101). 04/28/21   Sebastian Toribio GAILS, MD  albuterol  (VENTOLIN  HFA) 108 [phone] Base) MCG/ACT inhaler Inhale 2 puffs into the lungs every 6 (six) hours as needed for wheezing or shortness of breath. 05/03/21   Janifer Mitzie Retort, NP  aspirin  81 MG chewable tablet Chew 1 tablet (81 mg total) by mouth daily. 05/04/21   Janifer Mitzie Retort, NP  atorvastatin  (LIPITOR) 10 MG tablet Take 1 tablet (10 mg total) by mouth daily. 05/03/21   Janifer Mitzie Retort, NP  busPIRone  (BUSPAR ) 5 MG  tablet Take 1 tablet (5 mg total) by mouth 2 (two) times daily. 05/03/21   Janifer Mitzie Retort, NP  hydrOXYzine  (ATARAX /VISTARIL ) 25 MG tablet Take 1 tablet (25 mg total) by mouth 3 (three) times daily as needed for anxiety. 05/03/21   Janifer Mitzie Retort, NP  lisinopril -hydrochlorothiazide  (ZESTORETIC ) 20-12.5 MG tablet Take 1 tablet by mouth daily. 09/13/23   Brien Belvie BRAVO, MD  traZODone  (DESYREL ) 50 MG tablet Take 1 tablet (50 mg total) by mouth at bedtime as needed for sleep. 05/03/21   Janifer Mitzie Retort, NP    Allergies: Cymbalta  [duloxetine  hcl]    Review of Systems  Constitutional:  Negative for diaphoresis and fever.  Respiratory:  Positive for shortness of breath. Negative for cough.   Cardiovascular:  Positive for chest pain.  Gastrointestinal:  Negative for nausea and vomiting.    Updated Vital Signs BP 111/72   Pulse 99   Temp 97.6 F (36.4 C) (Oral)   Resp (!) 22   SpO2 95%   Physical Exam Vitals and nursing note reviewed.  Constitutional:      General: She is not in acute distress.    Appearance: Normal appearance. She is well-developed.  HENT:     Head: Normocephalic and atraumatic.  Eyes:     Conjunctiva/sclera: Conjunctivae normal.  Cardiovascular:     Rate and Rhythm: Normal rate and regular rhythm.     Heart sounds: Normal heart  sounds. No murmur heard. Pulmonary:     Effort: Pulmonary effort is normal. No respiratory distress.     Breath sounds: Normal breath sounds. No stridor. No wheezing.  Abdominal:     Palpations: Abdomen is soft.     Tenderness: There is no abdominal tenderness. There is no guarding or rebound.  Musculoskeletal:        General: No tenderness or deformity. Normal range of motion.     Cervical back: Neck supple.     Right lower leg: No tenderness. No edema.     Left lower leg: No tenderness. No edema.  Skin:    General: Skin is warm and dry.  Neurological:     General: No focal deficit present.     Mental Status: She  is alert.     GCS: GCS eye subscore is 4. GCS verbal subscore is 5. GCS motor subscore is 6.     (all labs ordered are listed, but only abnormal results are displayed) Labs Reviewed  BASIC METABOLIC PANEL WITH GFR  CBC  RAPID URINE DRUG SCREEN, HOSP PERFORMED  TROPONIN I (HIGH SENSITIVITY)    EKG: EKG Interpretation Date/Time:  Sunday May 05 2024 19:49:09 EDT Ventricular Rate:  101 PR Interval:  117 QRS Duration:  84 QT Interval:  370 QTC Calculation: 480 R Axis:   45  Text Interpretation: Sinus tachycardia Biatrial enlargement LVH with secondary repolarization abnormality ST depr, consider ischemia, inferior leads new ST changes from prior 9/22 Confirmed by Towana Sharper [phone]) on 05/05/2024 7:56:27 PM  Radiology: No results found.  {Document cardiac monitor, telemetry assessment procedure when appropriate:32947} Procedures   Medications Ordered in the ED  aspirin  chewable tablet 324 mg (324 mg Oral Given 05/05/24 2003)      {Click here for ABCD2, HEART and other calculators REFRESH Note before signing:1}                              Medical Decision Making Amount and/or Complexity of Data Reviewed Labs: ordered. Radiology: ordered.  Risk OTC drugs.   This patient complains of ***; this involves an extensive number of treatment Options and is a complaint that carries with it a high risk of complications and morbidity. The differential includes ***  I ordered, reviewed and interpreted labs, which included *** I ordered medication *** and reviewed PMP when indicated. I ordered imaging studies which included *** and I independently    visualized and interpreted imaging which showed *** Additional history obtained from *** Previous records obtained and reviewed *** I consulted *** and discussed lab and imaging findings and discussed disposition.  Cardiac monitoring reviewed, *** Social determinants considered, *** Critical Interventions: ***  After  the interventions stated above, I reevaluated the patient and found *** Admission and further testing considered, ***   {Document critical care time when appropriate  Document review of labs and clinical decision tools ie CHADS2VASC2, etc  Document your independent review of radiology images and any outside records  Document your discussion with family members, caretakers and with consultants  Document social determinants of health affecting pt's care  Document your decision making why or why not admission, treatments were needed:32947:::1}   Final diagnoses:  None    ED Discharge Orders     None

## 2024-05-05 NOTE — H&P (Signed)
 History and Physical    Patient: Carrie Adkins FMW:[id number] DOB: 17-Mar-1966 DOA: 05/05/2024 DOS: the patient was seen and examined on 05/05/2024 PCP: System, Provider Not In  Patient coming from: Home  Chief Complaint:  Chief Complaint  Patient presents with   Chest Pain   Homeless   Suicidal   HPI: Carrie Adkins is a 58 y.o. female with medical history significant of polysubstance abuse, cocaine abuse and tobacco abuse, remote nonobstructive coronary artery disease, COPD, depression with previous suicidal ideation, essential hypertension, hyperlipidemia who presented to the ER from an abundant house complaint of chest pain shortness of breath.  Patient reported doing crack cocaine about 2 days ago.  Patient also did complain some abdominal discomfort.  Patient is more somnolent.  History is therefore not very coherent.  Workup in the ER showed significant abnormalities including a creatinine of 5.6, hypokalemia and hyponatremia.  Patient appears dehydrated.  She has a chest x-ray that is negative and CT renal stone was done showing no acute abnormalities.  Patient is being admitted with AKI probably prerenal versus renal.  Review of Systems: As mentioned in the history of present illness. All other systems reviewed and are negative. Past Medical History:  Diagnosis Date   COPD without exacerbation (HCC) 03/01/2007   Qualifier: Diagnosis of  By: Armida MD, Curtis     Coronary artery disease involving native coronary artery of native heart 04/22/2021   Nicotine dependence, cigarettes, uncomplicated 04/22/2021   Polysubstance abuse (HCC) 04/22/2021   History reviewed. No pertinent surgical history. Social History:  reports that she has been smoking cigarettes. She has never used smokeless tobacco. She reports that she does not currently use alcohol. She reports current drug use. Drugs: Cocaine, Methamphetamines, and Marijuana.  Allergies  Allergen Reactions   Cymbalta   [Duloxetine  Hcl] Other (See Comments)    hallucinations    Family History  Problem Relation Age of Onset   Heart disease Neg Hx     Prior to Admission medications   Medication Sig Start Date End Date Taking? Authorizing Provider  acetaminophen  (TYLENOL ) 325 MG tablet Take 2 tablets (650 mg total) by mouth every 6 (six) hours as needed for mild pain (or Fever >/= 101). 04/28/21   Sebastian Toribio GAILS, MD  albuterol  (VENTOLIN  HFA) 108 [phone] Base) MCG/ACT inhaler Inhale 2 puffs into the lungs every 6 (six) hours as needed for wheezing or shortness of breath. 05/03/21   Janifer Mitzie Retort, NP  aspirin  81 MG chewable tablet Chew 1 tablet (81 mg total) by mouth daily. 05/04/21   Janifer Mitzie Retort, NP  atorvastatin  (LIPITOR) 10 MG tablet Take 1 tablet (10 mg total) by mouth daily. 05/03/21   Janifer Mitzie Retort, NP  busPIRone  (BUSPAR ) 5 MG tablet Take 1 tablet (5 mg total) by mouth 2 (two) times daily. 05/03/21   Janifer Mitzie Retort, NP  hydrOXYzine  (ATARAX /VISTARIL ) 25 MG tablet Take 1 tablet (25 mg total) by mouth 3 (three) times daily as needed for anxiety. 05/03/21   Janifer Mitzie Retort, NP  lisinopril -hydrochlorothiazide  (ZESTORETIC ) 20-12.5 MG tablet Take 1 tablet by mouth daily. 09/13/23   Brien Belvie BRAVO, MD  traZODone  (DESYREL ) 50 MG tablet Take 1 tablet (50 mg total) by mouth at bedtime as needed for sleep. 05/03/21   Janifer Mitzie Retort, NP    Physical Exam: Vitals:   05/05/24 1950 05/05/24 2022 05/05/24 2039  BP: 111/72 121/75 122/69  Pulse: 99  78  Resp: (!) 22 (!) 23 17  Temp: 97.6  F (36.4 C)    TempSrc: Oral    SpO2: 95%  99%   Constitutional: Acutely ill looking, somnolent, NAD, calm, comfortable Eyes: PERRL, lids and conjunctivae normal ENMT: Mucous membranes are dry posterior pharynx clear of any exudate or lesions.Normal dentition.  Neck: normal, supple, no masses, no thyromegaly Respiratory: clear to auscultation bilaterally, no wheezing, no crackles. Normal  respiratory effort. No accessory muscle use.  Cardiovascular: Sinus tachycardia, no murmurs / rubs / gallops. No extremity edema. 2+ pedal pulses. No carotid bruits.  Abdomen: no tenderness, no masses palpated. No hepatosplenomegaly. Bowel sounds positive.  Musculoskeletal: Good range of motion, no joint swelling or tenderness, Skin: no rashes, lesions, ulcers. No induration Neurologic: CN 2-12 grossly intact. Sensation intact, DTR normal. Strength 5/5 in all 4.  Psychiatric: Sleepy but arousable. Alert and oriented x 3. Normal mood  Data Reviewed:  Temperature 97.6, blood pressure 111/72, pulse 99 respirate 23, oxygen sat 95% on room air.  White count is 7.9 hemoglobin 15.9 platelets 494, sodium 134, potassium 3.2, CO2 of 18 glucose 101, BUN 82 creatinine 5.60 calcium  9.4 with a gap of 18.  Troponin is 21 and 31.  Chest x-ray showed no acute findings CT renal stone shows no acute abnormalities  Assessment and Plan:  #1 AKI: Probably prerenal.  Patient reported that she has not eaten or drank in a few days.  She was found in an abandoned house.  Will admit the patient.  Many liters of fluid boluses.  Maintenance fluid to be continued with.  Follow renal function closely.  Nephrology has been contacted by the ER and recommend consult in the morning.  Will continue to monitor renal function.  Will obtain renal ultrasound.  #2 hypokalemia: Continue to replete potassium  #3 polysubstance abuse: Counseling will be provided.  Close monitoring.  Resources will be given by social work  #4 major depressive disorder: Confirmed on resume home regimen.  #5 GERD: Continues PPIs  #6 COPD: No acute exacerbation  #7 anxiety disorder: Continue anxiolytics    Advance Care Planning:   Code Status: Full Code   Consults: Nephrology consult in the morning  Family Communication: No family at bedside  Severity of Illness: The appropriate patient status for this patient is INPATIENT. Inpatient status is  judged to be reasonable and necessary in order to provide the required intensity of service to ensure the patient's safety. The patient's presenting symptoms, physical exam findings, and initial radiographic and laboratory data in the context of their chronic comorbidities is felt to place them at high risk for further clinical deterioration. Furthermore, it is not anticipated that the patient will be medically stable for discharge from the hospital within 2 midnights of admission.   * I certify that at the point of admission it is my clinical judgment that the patient will require inpatient hospital care spanning beyond 2 midnights from the point of admission due to high intensity of service, high risk for further deterioration and high frequency of surveillance required.*  AuthorBETHA SIM KNOLL, MD 05/05/2024 10:54 PM  For on call review [email or web address].

## 2024-05-05 NOTE — ED Triage Notes (Signed)
 Pt here from living in a abandoned  house with c/o chest pain and sob , no n/v , pt did do some crack 2 days ago

## 2024-05-06 ENCOUNTER — Other Ambulatory Visit: Payer: Self-pay

## 2024-05-06 ENCOUNTER — Inpatient Hospital Stay (HOSPITAL_COMMUNITY): Payer: MEDICAID

## 2024-05-06 DIAGNOSIS — N179 Acute kidney failure, unspecified: Secondary | ICD-10-CM | POA: Diagnosis not present

## 2024-05-06 DIAGNOSIS — F1910 Other psychoactive substance abuse, uncomplicated: Secondary | ICD-10-CM | POA: Diagnosis not present

## 2024-05-06 DIAGNOSIS — J449 Chronic obstructive pulmonary disease, unspecified: Secondary | ICD-10-CM

## 2024-05-06 DIAGNOSIS — F331 Major depressive disorder, recurrent, moderate: Secondary | ICD-10-CM

## 2024-05-06 DIAGNOSIS — F17200 Nicotine dependence, unspecified, uncomplicated: Secondary | ICD-10-CM

## 2024-05-06 LAB — RENAL FUNCTION PANEL
Albumin: 3.2 g/dL — ABNORMAL LOW (ref 3.5–5.0)
Anion gap: 16 — ABNORMAL HIGH (ref 5–15)
BUN: 79 mg/dL — ABNORMAL HIGH (ref 6–20)
CO2: 16 mmol/L — ABNORMAL LOW (ref 22–32)
Calcium: 8.8 mg/dL — ABNORMAL LOW (ref 8.9–10.3)
Chloride: 101 mmol/L (ref 98–111)
Creatinine, Ser: 4.72 mg/dL — ABNORMAL HIGH (ref 0.44–1.00)
GFR, Estimated: 10 mL/min — ABNORMAL LOW (ref >60)
Glucose, Bld: 151 mg/dL — ABNORMAL HIGH (ref 70–99)
Phosphorus: 3.1 mg/dL (ref 2.5–4.6)
Potassium: 3.1 mmol/L — ABNORMAL LOW (ref 3.5–5.1)
Sodium: 133 mmol/L — ABNORMAL LOW (ref 135–145)

## 2024-05-06 LAB — HIV ANTIBODY (ROUTINE TESTING W REFLEX): HIV Screen 4th Generation wRfx: NONREACTIVE

## 2024-05-06 LAB — CBC
HCT: 42.1 % (ref 36.0–46.0)
Hemoglobin: 13.9 g/dL (ref 12.0–15.0)
MCH: 29.4 pg (ref 26.0–34.0)
MCHC: 33 g/dL (ref 30.0–36.0)
MCV: 89 fL (ref 80.0–100.0)
Platelets: 377 K/uL (ref 150–400)
RBC: 4.73 MIL/uL (ref 3.87–5.11)
RDW: 13.6 % (ref 11.5–15.5)
WBC: 7.2 K/uL (ref 4.0–10.5)
nRBC: 0 % (ref 0.0–0.2)

## 2024-05-06 LAB — CREATININE, SERUM
Creatinine, Ser: 4.86 mg/dL — ABNORMAL HIGH (ref 0.44–1.00)
GFR, Estimated: 10 mL/min — ABNORMAL LOW (ref >60)

## 2024-05-06 LAB — PHOSPHORUS: Phosphorus: 3.1 mg/dL (ref 2.5–4.6)

## 2024-05-06 MED ORDER — POTASSIUM CHLORIDE 20 MEQ PO PACK
40.0000 meq | PACK | Freq: Once | ORAL | Status: AC
  Administered 2024-05-06: 40 meq via ORAL
  Filled 2024-05-06: qty 2

## 2024-05-06 MED ORDER — CAMPHOR-MENTHOL 0.5-0.5 % EX LOTN: 1.0000 | TOPICAL_LOTION | Freq: Three times a day (TID) | CUTANEOUS | Status: DC | PRN

## 2024-05-06 MED ORDER — HEPARIN SODIUM (PORCINE) 5000 UNIT/ML IJ SOLN
5000.0000 [IU] | Freq: Three times a day (TID) | INTRAMUSCULAR | Status: DC
  Administered 2024-05-06 – 2024-05-15 (×29): 5000 [IU] via SUBCUTANEOUS
  Filled 2024-05-06 (×29): qty 1

## 2024-05-06 MED ORDER — ACETAMINOPHEN 650 MG RE SUPP: 650.0000 mg | Freq: Four times a day (QID) | RECTAL | Status: DC | PRN

## 2024-05-06 MED ORDER — HYDROXYZINE HCL 25 MG PO TABS
25.0000 mg | ORAL_TABLET | Freq: Three times a day (TID) | ORAL | Status: DC | PRN
  Administered 2024-05-13: 25 mg via ORAL
  Filled 2024-05-06: qty 1

## 2024-05-06 MED ORDER — CALCIUM CARBONATE ANTACID 1250 MG/5ML PO SUSP: 500.0000 mg | Freq: Four times a day (QID) | ORAL | Status: DC | PRN

## 2024-05-06 MED ORDER — DOCUSATE SODIUM 283 MG RE ENEM: 1.0000 | ENEMA | RECTAL | Status: DC | PRN

## 2024-05-06 MED ORDER — ALBUTEROL SULFATE HFA 108 (90 BASE) MCG/ACT IN AERS
2.0000 | INHALATION_SPRAY | Freq: Four times a day (QID) | RESPIRATORY_TRACT | Status: DC | PRN
Start: 2024-05-06 — End: 2024-05-06

## 2024-05-06 MED ORDER — ONDANSETRON HCL 4 MG/2ML IJ SOLN: 4.0000 mg | Freq: Four times a day (QID) | INTRAMUSCULAR | Status: DC | PRN

## 2024-05-06 MED ORDER — ONDANSETRON HCL 4 MG PO TABS
4.0000 mg | ORAL_TABLET | Freq: Four times a day (QID) | ORAL | Status: DC | PRN
  Administered 2024-05-07 – 2024-05-14 (×2): 4 mg via ORAL
  Filled 2024-05-06 (×2): qty 1

## 2024-05-06 MED ORDER — ACETAMINOPHEN 325 MG PO TABS
650.0000 mg | ORAL_TABLET | Freq: Four times a day (QID) | ORAL | Status: DC | PRN
  Administered 2024-05-06 – 2024-05-14 (×12): 650 mg via ORAL
  Filled 2024-05-06 (×13): qty 2

## 2024-05-06 MED ORDER — BUSPIRONE HCL 5 MG PO TABS
5.0000 mg | ORAL_TABLET | Freq: Two times a day (BID) | ORAL | Status: DC
  Administered 2024-05-06 – 2024-05-15 (×18): 5 mg via ORAL
  Filled 2024-05-06 (×18): qty 1

## 2024-05-06 MED ORDER — KCL IN DEXTROSE-NACL 20-5-0.9 MEQ/L-%-% IV SOLN
INTRAVENOUS | Status: AC
  Filled 2024-05-06 (×5): qty 1000

## 2024-05-06 MED ORDER — SORBITOL 70 % SOLN: 30.0000 mL | Status: DC | PRN

## 2024-05-06 MED ORDER — NEPRO/CARBSTEADY PO LIQD: 237.0000 mL | Freq: Three times a day (TID) | ORAL | Status: DC | PRN

## 2024-05-06 MED ORDER — ALBUTEROL SULFATE (2.5 MG/3ML) 0.083% IN NEBU: 2.5000 mg | INHALATION_SOLUTION | Freq: Four times a day (QID) | RESPIRATORY_TRACT | Status: DC | PRN

## 2024-05-06 NOTE — Plan of Care (Signed)

## 2024-05-06 NOTE — ED Notes (Signed)
 CCMD notifed. Pt on monitor.

## 2024-05-06 NOTE — ED Notes (Signed)
 Staffing called for sitter. Staffing states they do not have anyone at this time. Pt added to staffing's list.

## 2024-05-06 NOTE — Progress Notes (Signed)
 Progress Note   Patient: Carrie Adkins FMW:[id number] DOB: 1966-06-23 DOA: 05/05/2024     1 DOS: the patient was seen and examined on 05/06/2024   Brief hospital course: Carrie Adkins is a 58 y.o. female with medical history significant of polysubstance abuse, cocaine abuse and tobacco abuse, remote nonobstructive coronary artery disease, COPD, depression with previous suicidal ideation, essential hypertension, hyperlipidemia who presented to the ER from an abandoned house complaint of chest pain shortness of breath.   Workup in the ER showed significant abnormalities including a creatinine of 5.6, hypokalemia and hyponatremia.  Patient appears dehydrated, not eaten for several days. Admitted to TRH service for further management and evaluation.   05/06/24 She did have suicidal ideations, one to one sitter and psychiatry consulted.  Assessment and Plan: AKI:  Prerenal in the setting of poor oral intake, food insecurity. Possible chronic component and on top takes ACEI. Patient reported that she has not eaten or drank in a few days, was found in an abandoned house. She got IV fluid boluses. Continue maintenance fluids with D5, kcl.  Follow daily renal function. Avoid nephrotoxic drugs. Nephrology consulted. Renal ultrasound showed suspected medical renal disease.   Hypokalemia: Continue to replete potassium and recheck.   Polysubstance abuse:  Counseling will be provided.  Close monitoring.   Resources will be given by social work   Major depressive disorder:  Suicidal ideations- She did express suicidal ideations. One to one sitter ordered. Psychiatry consulted. Resumed home dose Buspar .   GERD: Continues PPIs   COPD: No acute exacerbation.  Homeless- TOC to provide resources.        Out of bed to chair. Incentive spirometry. Nursing supportive care. Fall, aspiration precautions. Diet:  Diet Orders (From admission, onward)     Start     Ordered    05/06/24 0017  Diet renal/carb modified with fluid restriction Diet-HS Snack? Nothing; Fluid restriction: 1200 mL Fluid; Room service appropriate? Yes; Fluid consistency: Thin  Diet effective now       Question Answer Comment  Diet-HS Snack? Nothing   Fluid restriction: 1200 mL Fluid   Room service appropriate? Yes   Fluid consistency: Thin      05/06/24 0016           DVT prophylaxis: heparin  injection 5,000 Units Start: 05/06/24 0030  Level of care: Telemetry Medical   Code Status: Full Code  Subjective: Patient is seen and examined today morning. She is tearful. Sitter at bedside. Denies abdominal pain. Feels better since admission.  Physical Exam: Vitals:   05/06/24 0800 05/06/24 0837 05/06/24 1045 05/06/24 1143  BP: 100/63  102/70 114/73  Pulse: 83  81 79  Resp: 16  18 20   Temp:  98.3 F (36.8 C)  (!) 97.3 F (36.3 C)  TempSrc:  Oral  Axillary  SpO2: 97%  99% 100%    General - Middle aged [place] American female, in distress, tearful. HEENT - PERRLA, EOMI, atraumatic head, non tender sinuses. Lung - Clear, no rales, rhonchi, wheezes. Heart - S1, S2 heard, no murmurs, rubs, trace pedal edema. Abdomen - Soft, diffuse tender, no guarding, bowel sounds good Neuro - Alert, awake and oriented x 3, non focal exam. Skin - Warm and dry.  Data Reviewed:      Latest Ref Rng & Units 05/06/2024    2:51 AM 05/05/2024    7:52 PM 04/25/2021   12:45 PM  CBC  WBC 4.0 - 10.5 K/uL 7.2  7.9  6.0  Hemoglobin 12.0 - 15.0 g/dL 86.0  84.0  87.5   Hematocrit 36.0 - 46.0 % 42.1  47.1  37.7   Platelets 150 - 400 K/uL 377  494  325       Latest Ref Rng & Units 05/06/2024    2:51 AM 05/05/2024    9:14 PM 04/25/2021   12:45 PM  BMP  Glucose 70 - 99 mg/dL 848  898  897   BUN 6 - 20 mg/dL 79  82  15   Creatinine 0.44 - 1.00 mg/dL 9.55 - 8.99 mg/dL 5.13    5.27  4.39  9.11   Sodium 135 - 145 mmol/L 133  134  145   Potassium 3.5 - 5.1 mmol/L 3.1  3.2  3.7   Chloride 98 - 111 mmol/L  101  98  112   CO2 22 - 32 mmol/L 16  18  27    Calcium  8.9 - 10.3 mg/dL 8.8  9.4  9.2    US  RENAL Result Date: 05/06/2024 EXAM: US  Retroperitoneum Complete, Renal. CLINICAL HISTORY: AKI (acute kidney injury) (HCC) TECHNIQUE: Real-time ultrasound of the retroperitoneum (complete) with image documentation. COMPARISON: 05/05/2024 CT abdomen/pelvis. FINDINGS: RIGHT KIDNEY: The right kidney measures 8.8 x 4.6 x 6.0 cm with a calculated volume of 128 ml. Echogenic renal parenchyma suggestive of medical renal disease. No hydronephrosis, renal stone, or mass visualized. LEFT KIDNEY: The left kidney measures 9.7 x 4.4 x 5.1 cm with a calculated volume of 114 ml. Echogenic renal parenchyma suggestive of medical renal disease. No hydronephrosis, renal stone, or mass visualized. BLADDER: Unremarkable as visualized. IMPRESSION: 1. No hydronephrosis. 2. Suspected medical renal disease. Electronically signed by: Pinkie Pebbles MD 05/06/2024 02:47 AM EDT RP Workstation: [id number]   CT Renal Stone Study Result Date: 05/05/2024 EXAM: CT ABDOMEN AND PELVIS WITHOUT CONTRAST 05/05/2024 10:26:41 PM TECHNIQUE: CT of the abdomen and pelvis was performed without the administration of intravenous contrast. Multiplanar reformatted images are provided for review. Automated exposure control, iterative reconstruction, and/or weight-based adjustment of the mA/kV was utilized to reduce the radiation dose to as low as reasonably achievable. COMPARISON: None available. CLINICAL HISTORY: Renal ischemia or infarction. Pt here from living in a abandoned house with c/o chest pain and sob, no n/v, pt did do some crack 2 days ago. FINDINGS: LOWER CHEST: No acute abnormality. LIVER: The liver is unremarkable. GALLBLADDER AND BILE DUCTS: Layering gallbladder sludge, without associated inflammatory changes. No biliary ductal dilatation. SPLEEN: No acute abnormality. PANCREAS: No acute abnormality. ADRENAL GLANDS: No acute abnormality. KIDNEYS,  URETERS AND BLADDER: No stones in the kidneys or ureters. No hydronephrosis. No perinephric or periureteral stranding. Urinary bladder is unremarkable. GI AND BOWEL: Normal appendix (image 71). Stomach demonstrates no acute abnormality. There is no bowel obstruction. PERITONEUM AND RETROPERITONEUM: No ascites. No free air. VASCULATURE: Atherosclerotic calcifications of the abdominal aorta and branch vessels. LYMPH NODES: No lymphadenopathy. REPRODUCTIVE ORGANS: Calcified uterine fibroids. BONES AND SOFT TISSUES: Bilateral hip arthroplasties. No acute osseous abnormality. No focal soft tissue abnormality. IMPRESSION: 1. No acute abnormalities. 2. Ancillary findings, as above. Electronically signed by: Pinkie Pebbles MD 05/05/2024 10:31 PM EDT RP Workstation: [id number]   DG Chest Port 1 View Result Date: 05/05/2024 CLINICAL DATA:  Chest pain EXAM: PORTABLE CHEST 1 VIEW COMPARISON:  Chest x-ray 04/22/2021 FINDINGS: The heart size and mediastinal contours are within normal limits. Both lungs are clear. The visualized skeletal structures are unremarkable. IMPRESSION: No active disease. Electronically Signed   By: Greig Maple HERO.D.  On: 05/05/2024 20:16    Family Communication: Discussed with patient, understand and agree. All questions answered.  Disposition: Status is: Inpatient Remains inpatient appropriate because: AKI, depression  Planned Discharge Destination: homeless     Time spent: 47 minutes  Author: Concepcion Riser, MD 05/06/2024 3:19 PM Secure chat 7am to 7pm For on call review [email or web address].

## 2024-05-07 DIAGNOSIS — F17200 Nicotine dependence, unspecified, uncomplicated: Secondary | ICD-10-CM | POA: Diagnosis not present

## 2024-05-07 DIAGNOSIS — F331 Major depressive disorder, recurrent, moderate: Secondary | ICD-10-CM | POA: Diagnosis not present

## 2024-05-07 DIAGNOSIS — N179 Acute kidney failure, unspecified: Secondary | ICD-10-CM | POA: Diagnosis not present

## 2024-05-07 DIAGNOSIS — F1910 Other psychoactive substance abuse, uncomplicated: Secondary | ICD-10-CM | POA: Diagnosis not present

## 2024-05-07 LAB — BASIC METABOLIC PANEL WITH GFR
Anion gap: 11 (ref 5–15)
BUN: 51 mg/dL — ABNORMAL HIGH (ref 6–20)
CO2: 18 mmol/L — ABNORMAL LOW (ref 22–32)
Calcium: 8.6 mg/dL — ABNORMAL LOW (ref 8.9–10.3)
Chloride: 109 mmol/L (ref 98–111)
Creatinine, Ser: 2.51 mg/dL — ABNORMAL HIGH (ref 0.44–1.00)
GFR, Estimated: 21 mL/min — ABNORMAL LOW (ref >60)
Glucose, Bld: 87 mg/dL (ref 70–99)
Potassium: 4 mmol/L (ref 3.5–5.1)
Sodium: 138 mmol/L (ref 135–145)

## 2024-05-07 NOTE — Plan of Care (Signed)
  Problem: Clinical Measurements: Goal: Cardiovascular complication will be avoided Outcome: Not Progressing   Problem: Activity: Goal: Risk for activity intolerance will decrease Outcome: Not Progressing   Problem: Coping: Goal: Level of anxiety will decrease Outcome: Not Progressing   Problem: Safety: Goal: Ability to remain free from injury will improve Outcome: Not Progressing

## 2024-05-07 NOTE — Plan of Care (Signed)

## 2024-05-07 NOTE — Progress Notes (Signed)
 Progress Note   Patient: Carrie Adkins FMW:[id number] DOB: 1966-08-06 DOA: 05/05/2024     2 DOS: the patient was seen and examined on 05/07/2024   Brief hospital course: Giamarie Bueche is a 58 y.o. female with medical history significant of polysubstance abuse, cocaine abuse and tobacco abuse, remote nonobstructive coronary artery disease, COPD, depression with previous suicidal ideation, essential hypertension, hyperlipidemia who presented to the ER from an abandoned house complaint of chest pain shortness of breath.   Workup in the ER showed significant abnormalities including a creatinine of 5.6, hypokalemia and hyponatremia.  Patient appears dehydrated, not eaten for several days. Admitted to TRH service for further management and evaluation.   05/06/24 She did have suicidal ideations, one to one sitter and psychiatry consulted. 9/23//25  - psych evaluated, not suicidal, can be off 1:1  Assessment and Plan: AKI:  Prerenal in the setting of poor oral intake, food insecurity. Possible chronic component and on top takes ACEI. Patient eating and drinking poor for many days, was found in an abandoned house. She got IV fluid boluses. Continue maintenance fluids with D5, kcl.  Renal function improving. Follow daily renal function. Avoid nephrotoxic drugs. Renal ultrasound showed suspected medical renal disease. She will need outpatient nephrology follow up which will be arranged.   Hypokalemia: Continue to replete potassium and recheck.   Polysubstance abuse:  Counseling will be provided.  Close monitoring.   Resources will be provided by social work   Major depressive disorder:  Suicidal ideations- Seen by Psychiatry, low risk for self harm at this time. No need for IVC or one to one sitter. She will need Resumed home dose Buspar .   GERD: Continues PPIs   COPD: No acute exacerbation.  Homeless- TOC to provide resources.        Out of bed to chair. Incentive  spirometry. Nursing supportive care. Fall, aspiration precautions. Diet:  Diet Orders (From admission, onward)     Start     Ordered   05/06/24 0017  Diet renal/carb modified with fluid restriction Diet-HS Snack? Nothing; Fluid restriction: 1200 mL Fluid; Room service appropriate? Yes; Fluid consistency: Thin  Diet effective now       Question Answer Comment  Diet-HS Snack? Nothing   Fluid restriction: 1200 mL Fluid   Room service appropriate? Yes   Fluid consistency: Thin      05/06/24 0016           DVT prophylaxis: heparin  injection 5,000 Units Start: 05/06/24 0030  Level of care: Telemetry Medical   Code Status: Full Code  Subjective: Patient is seen and examined today morning. She is better today. No suicidal or homicidal ideations. She does report loose stools 3/ day. Sitter at bedside. Has lower abdomen pain.  Physical Exam: Vitals:   05/06/24 1045 05/06/24 1143 05/07/24 0137 05/07/24 0303  BP: 102/70 114/73 114/68 119/61  Pulse: 81 79 81 85  Resp: 18 20 18 17   Temp:  (!) 97.3 F (36.3 C) 98.2 F (36.8 C) 98.6 F (37 C)  TempSrc:  Axillary Oral   SpO2: 99% 100% 96% 98%    General - Middle aged [place] American female, no distress. HEENT - PERRLA, EOMI, atraumatic head, non tender sinuses. Lung - Clear, no rales, rhonchi, wheezes. Heart - S1, S2 heard, no murmurs, rubs, trace pedal edema. Abdomen - Soft, lower abdomen tender, no guarding, bowel sounds good Neuro - Alert, awake and oriented x 3, non focal exam. Skin - Warm and dry.  Data  Reviewed:      Latest Ref Rng & Units 05/06/2024    2:51 AM 05/05/2024    7:52 PM 04/25/2021   12:45 PM  CBC  WBC 4.0 - 10.5 K/uL 7.2  7.9  6.0   Hemoglobin 12.0 - 15.0 g/dL 86.0  84.0  87.5   Hematocrit 36.0 - 46.0 % 42.1  47.1  37.7   Platelets 150 - 400 K/uL 377  494  325       Latest Ref Rng & Units 05/07/2024    4:20 AM 05/06/2024    2:51 AM 05/05/2024    9:14 PM  BMP  Glucose 70 - 99 mg/dL 87  848  898   BUN  6 - 20 mg/dL 51  79  82   Creatinine 0.44 - 1.00 mg/dL 7.48  5.13    5.27  4.39   Sodium 135 - 145 mmol/L 138  133  134   Potassium 3.5 - 5.1 mmol/L 4.0  3.1  3.2   Chloride 98 - 111 mmol/L 109  101  98   CO2 22 - 32 mmol/L 18  16  18    Calcium  8.9 - 10.3 mg/dL 8.6  8.8  9.4    US  RENAL Result Date: 05/06/2024 EXAM: US  Retroperitoneum Complete, Renal. CLINICAL HISTORY: AKI (acute kidney injury) (HCC) TECHNIQUE: Real-time ultrasound of the retroperitoneum (complete) with image documentation. COMPARISON: 05/05/2024 CT abdomen/pelvis. FINDINGS: RIGHT KIDNEY: The right kidney measures 8.8 x 4.6 x 6.0 cm with a calculated volume of 128 ml. Echogenic renal parenchyma suggestive of medical renal disease. No hydronephrosis, renal stone, or mass visualized. LEFT KIDNEY: The left kidney measures 9.7 x 4.4 x 5.1 cm with a calculated volume of 114 ml. Echogenic renal parenchyma suggestive of medical renal disease. No hydronephrosis, renal stone, or mass visualized. BLADDER: Unremarkable as visualized. IMPRESSION: 1. No hydronephrosis. 2. Suspected medical renal disease. Electronically signed by: Pinkie Pebbles MD 05/06/2024 02:47 AM EDT RP Workstation: [id number]   CT Renal Stone Study Result Date: 05/05/2024 EXAM: CT ABDOMEN AND PELVIS WITHOUT CONTRAST 05/05/2024 10:26:41 PM TECHNIQUE: CT of the abdomen and pelvis was performed without the administration of intravenous contrast. Multiplanar reformatted images are provided for review. Automated exposure control, iterative reconstruction, and/or weight-based adjustment of the mA/kV was utilized to reduce the radiation dose to as low as reasonably achievable. COMPARISON: None available. CLINICAL HISTORY: Renal ischemia or infarction. Pt here from living in a abandoned house with c/o chest pain and sob, no n/v, pt did do some crack 2 days ago. FINDINGS: LOWER CHEST: No acute abnormality. LIVER: The liver is unremarkable. GALLBLADDER AND BILE DUCTS: Layering  gallbladder sludge, without associated inflammatory changes. No biliary ductal dilatation. SPLEEN: No acute abnormality. PANCREAS: No acute abnormality. ADRENAL GLANDS: No acute abnormality. KIDNEYS, URETERS AND BLADDER: No stones in the kidneys or ureters. No hydronephrosis. No perinephric or periureteral stranding. Urinary bladder is unremarkable. GI AND BOWEL: Normal appendix (image 71). Stomach demonstrates no acute abnormality. There is no bowel obstruction. PERITONEUM AND RETROPERITONEUM: No ascites. No free air. VASCULATURE: Atherosclerotic calcifications of the abdominal aorta and branch vessels. LYMPH NODES: No lymphadenopathy. REPRODUCTIVE ORGANS: Calcified uterine fibroids. BONES AND SOFT TISSUES: Bilateral hip arthroplasties. No acute osseous abnormality. No focal soft tissue abnormality. IMPRESSION: 1. No acute abnormalities. 2. Ancillary findings, as above. Electronically signed by: Pinkie Pebbles MD 05/05/2024 10:31 PM EDT RP Workstation: [id number]   DG Chest Port 1 View Result Date: 05/05/2024 CLINICAL DATA:  Chest pain EXAM: PORTABLE CHEST 1 VIEW  COMPARISON:  Chest x-ray 04/22/2021 FINDINGS: The heart size and mediastinal contours are within normal limits. Both lungs are clear. The visualized skeletal structures are unremarkable. IMPRESSION: No active disease. Electronically Signed   By: Greig Pique M.D.   On: 05/05/2024 20:16    Family Communication: Discussed with patient, understand and agree. All questions answered.  Disposition: Status is: Inpatient Remains inpatient appropriate because: AKI, homeless.  Planned Discharge Destination: homeless     Time spent: 43 minutes  Author: Concepcion Riser, MD 05/07/2024 7:22 PM Secure chat 7am to 7pm For on call review [email or web address].

## 2024-05-07 NOTE — Consult Note (Cosign Needed Addendum)
 [facility] Psychiatry Face-to-Face Psychiatric Evaluation   Service Date: May 07, 2024 LOS:  LOS: 2 days    Assessment   Carrie Adkins is a 58 y.o. female admitted medically on 05/05/2024  7:46 PM for chest pain and SOB. Psychiatric history is significant for polysubstance abuse (cocaine, marijuana, methamphetamine), depression, and anxiety and has a past medical history of  COPD, hypertension, hyperlipidemia, diastolic CHF, history of MI, chronic pain, arthritis .Psychiatry was consulted for suicidal ideations by Darci Pore, MD.   Workup in the ER showed significant abnormalities including a creatinine of 5.6, hypokalemia and hyponatremia.  Patient appears dehydrated, not eaten for several days. Admitted to TRH service for further management and evaluation.   Her presentation of SI appears most consistent with MDD exacerbated by acute psychosocial stressors (recent homelessness, estrangement from family, lack of support, and discontinuation of psychiatric medications). She meets MDD criteria, given her sustained low mood, anhedonia, poor sleep, poor appetite, decreased concentration, and diminished energy.  Additionally, her report of hallucinations (seeing shadows and hearing voices) may reflect transient psychotic symptoms due to substance-induced psychotic disorder. Her withdrawal symptoms (mild tremors, nausea, diarrhea) are consistent with polysubstance withdrawal, which may be contributing to her mood disturbance and psychosis-like experiences.   At this time, the patient is not actively suicidal or homicidal, is able to contract for safety, and does not meet criteria for IVC or need for 1:1 observation.  Diagnoses:  Active Hospital problems: Principal Problem:   AKI (acute kidney injury) Active Problems:   TOBACCO ABUSE   COPD without exacerbation (HCC)   GERD without esophagitis   Polysubstance abuse (HCC)   Coronary artery disease involving native  coronary artery of native heart   Major depressive disorder, recurrent episode, moderate (HCC)   Anxiety disorder, unspecified   Moderate cocaine use disorder (HCC)     Plan  ## Safety and Observation Level:  - Based on my clinical evaluation, I estimate the patient to be at low risk of self harm in the current setting  ## Medications:  -- Continue Buspar  5 mg  ## Medical Decision Making Capacity:  Not assessed on this encounter  ## Further Work-up:  Per primary  ## Disposition:  TBD  ## Behavioral / Environmental:  --Routine obs  ##Legal Status  Thank you for this consult request. Recommendations have been communicated to the primary team.  We will sign off at this time.   Alan Maiden, MD   NEW history  Relevant Aspects of Hospital Course:  Admitted on 05/05/2024 for chest pain and SOB.   Patient Report:  Patient states she presented due to medical complications related to recent homelessness. She reports previously being in a rehabilitation facility in [place], [place] for polysubstance abuse, then moving back to [place], [place] to assist with "family issues." It is unclear whether she initially stayed in a hotel or with her daughter, but she ultimately became displaced and has been homeless for the past 2-3 weeks. She reports that her daughter was with her at first but that she has been alone for several days prior to hospitalization.  She reports not having eaten or had water for several days. She endorses low mood, decreased energy, poor concentration, low appetite, and poor sleep, attributing these symptoms to her current situation. She endorses auditory and visual hallucinations, describing shadows, figures, dots in vision, and voices over the past few days.  She reports withdrawal symptoms including mild tremors, nausea, and diarrhea. UDS was not collected upon admission  two days ago, but patient endorses cocaine use before hospitalization.   She shares she has four  daughters and nine granddaughters, and moved to the area to help her family. However, she states that when she contacted her eldest daughter upon arrival to the hospital, she was told, "It is your fault you are in the hospital," and was denied shelter.  Patient initially endorsed suicidal ideation but, on further questioning, denied active SI or HI and was able to contract for safety.   ROS:  As above  Collateral information:  none  Psychiatric History:  Information collected from patient, EMR  Family psych history:  Niece: Mood disorder Brother: Substance abuse   Social History:  As above   Family History:  The patient's family history is not on file.  Medical History: Past Medical History:  Diagnosis Date   COPD without exacerbation (HCC) 03/01/2007   Qualifier: Diagnosis of  By: Armida MD, Curtis     Coronary artery disease involving native coronary artery of native heart 04/22/2021   Nicotine dependence, cigarettes, uncomplicated 04/22/2021   Polysubstance abuse (HCC) 04/22/2021    Surgical History: History reviewed. No pertinent surgical history.  Medications:   Current Facility-Administered Medications:    acetaminophen  (TYLENOL ) tablet 650 mg, 650 mg, Oral, Q6H PRN, 650 mg at 05/06/24 1953 **OR** acetaminophen  (TYLENOL ) suppository 650 mg, 650 mg, Rectal, Q6H PRN, Sim, Mohammad L, MD   albuterol  (PROVENTIL ) (2.5 MG/3ML) 0.083% nebulizer solution 2.5 mg, 2.5 mg, Inhalation, Q6H PRN, Garba, Mohammad L, MD   busPIRone  (BUSPAR ) tablet 5 mg, 5 mg, Oral, BID, Sreeram, Narendranath, MD, 5 mg at 05/06/24 1953   calcium  carbonate (dosed in mg elemental calcium ) suspension 500 mg of elemental calcium , 500 mg of elemental calcium , Oral, Q6H PRN, Garba, Mohammad L, MD   camphor-menthol  (SARNA) lotion 1 Application, 1 Application, Topical, Q8H PRN **AND** hydrOXYzine  (ATARAX ) tablet 25 mg, 25 mg, Oral, Q8H PRN, Garba, Mohammad L, MD   docusate sodium  (ENEMEEZ) enema 283 mg, 1  enema, Rectal, PRN, Sim, Mohammad L, MD   feeding supplement (NEPRO CARB STEADY) liquid 237 mL, 237 mL, Oral, TID PRN, Sim, Mohammad L, MD   heparin  injection 5,000 Units, 5,000 Units, Subcutaneous, Q8H, Garba, Mohammad L, MD, 5,000 Units at 05/07/24 0532   ondansetron  (ZOFRAN ) tablet 4 mg, 4 mg, Oral, Q6H PRN **OR** ondansetron  (ZOFRAN ) injection 4 mg, 4 mg, Intravenous, Q6H PRN, Garba, Mohammad L, MD   sorbitol  70 % solution 30 mL, 30 mL, Oral, PRN, Garba, Mohammad L, MD  Allergies: Allergies  Allergen Reactions   Cymbalta  [Duloxetine  Hcl] Other (See Comments)    Hallucinations; psychosis       Objective  Vital signs:  Temp:  [97.3 F (36.3 C)-98.6 F (37 C)] 98.6 F (37 C) (09/23 0303) Pulse Rate:  [79-85] 85 (09/23 0303) Resp:  [17-20] 17 (09/23 0303) BP: (102-119)/(61-73) 119/61 (09/23 0303) SpO2:  [96 %-100 %] 98 % (09/23 0303)  Psychiatric Specialty Exam: Physical Exam Constitutional:      Appearance: the patient is not toxic-appearing.  Pulmonary:     Effort: Pulmonary effort is normal.  Neurological:     General: No focal deficit present.     Mental Status: the patient is alert and oriented to person, place, and time.   Review of Systems  Respiratory:  Negative for shortness of breath.   Cardiovascular:  Positive for chest pain Gastrointestinal:  Positive for abdominal pain, diarrhea, nausea. Negative for constipation and vomiting.  Neurological:  Negative for headaches.  BP 119/61 (BP Location: Left Arm)   Pulse 85   Temp 98.6 F (37 C)   Resp 17   SpO2 98%   General Appearance: Tearful  Eye Contact:  Poor  Speech:  Clear and Coherent  Volume:  Normal  Mood:  I feel horrible  Affect:  Congruent  Thought Process:  Coherent  Orientation:  Full (Time, Place, and Person)  Thought Content: Logical   Suicidal Thoughts:  Denies  Homicidal Thoughts:  Denies  Memory:  Immediate;   Good  Judgement:  fair  Insight:  fair  Psychomotor Activity:   Normal  Concentration:  Concentration: Good  Recall:  Good  Fund of Knowledge: Good  Language: Good  Akathisia:  No  Handed:    AIMS (if indicated): not done  Assets:  Communication Skills Desire for Improvement  ADL's:  Intact  Cognition: WNL  Sleep:  Fair   Alan Maiden, MD PGY-1

## 2024-05-08 DIAGNOSIS — F17200 Nicotine dependence, unspecified, uncomplicated: Secondary | ICD-10-CM | POA: Diagnosis not present

## 2024-05-08 DIAGNOSIS — F1910 Other psychoactive substance abuse, uncomplicated: Secondary | ICD-10-CM | POA: Diagnosis not present

## 2024-05-08 DIAGNOSIS — F331 Major depressive disorder, recurrent, moderate: Secondary | ICD-10-CM | POA: Diagnosis not present

## 2024-05-08 DIAGNOSIS — N179 Acute kidney failure, unspecified: Secondary | ICD-10-CM | POA: Diagnosis not present

## 2024-05-08 LAB — BASIC METABOLIC PANEL WITH GFR
Anion gap: 10 (ref 5–15)
BUN: 30 mg/dL — ABNORMAL HIGH (ref 6–20)
CO2: 21 mmol/L — ABNORMAL LOW (ref 22–32)
Calcium: 8.7 mg/dL — ABNORMAL LOW (ref 8.9–10.3)
Chloride: 109 mmol/L (ref 98–111)
Creatinine, Ser: 1.8 mg/dL — ABNORMAL HIGH (ref 0.44–1.00)
GFR, Estimated: 32 mL/min — ABNORMAL LOW (ref >60)
Glucose, Bld: 90 mg/dL (ref 70–99)
Potassium: 3.6 mmol/L (ref 3.5–5.1)
Sodium: 140 mmol/L (ref 135–145)

## 2024-05-08 MED ORDER — KCL IN DEXTROSE-NACL 20-5-0.9 MEQ/L-%-% IV SOLN
INTRAVENOUS | Status: AC
  Filled 2024-05-08: qty 1000

## 2024-05-08 NOTE — Consult Note (Addendum)
 [facility] Psychiatry Face-to-Face Psychiatric Evaluation   Service Date: May 08, 2024 LOS:  LOS: 3 days    Assessment   Carrie Adkins is a 58 y.o. female admitted medically on 05/05/2024  7:46 PM for chest pain and SOB. Psychiatric history is significant for polysubstance abuse (cocaine, marijuana, methamphetamine), depression, and anxiety and has a past medical history of  COPD, hypertension, hyperlipidemia, diastolic CHF, history of MI, chronic pain, arthritis .Psychiatry was consulted for suicidal ideations by Darci Pore, MD.   Her presentation of SI appears most consistent with MDD exacerbated by acute psychosocial stressors (recent homelessness, estrangement from family, lack of support, and discontinuation of psychiatric medications). She meets MDD criteria, given her sustained low mood, anhedonia, poor sleep, poor appetite, decreased concentration, and diminished energy.  Suicidal ideation is contingent on psychosocial stressors (housing insecurity). Sister is in the room now trying to help get her placement to a rehab center, needs to speak with social for placement. No evidence of imminent risk outside of these conditions at this time.   We recommend primary to continue with plans to get patient to [facility].   Diagnoses:  Active Hospital problems: Principal Problem:   AKI (acute kidney injury) Active Problems:   TOBACCO ABUSE   COPD without exacerbation (HCC)   GERD without esophagitis   Polysubstance abuse (HCC)   Coronary artery disease involving native coronary artery of native heart   Major depressive disorder, recurrent episode, moderate (HCC)   Anxiety disorder, unspecified   Moderate cocaine use disorder (HCC)     Plan  ## Safety and Observation Level:  - Based on my clinical evaluation, I estimate the patient to be at low risk of self harm in the current setting  ## Medications:  -- Continue Buspar  5 mg  ## Medical Decision Making  Capacity:  Not assessed on this encounter  ## Further Work-up:  Per primary  ## Disposition:  TBD  ## Behavioral / Environmental:  --Routine obs  ##Legal Status  Thank you for this consult request. Recommendations have been communicated to the primary team.  We will sign off at this time.   Alan Maiden, MD   NEW history  Relevant Aspects of Hospital Course:  Admitted on 05/05/2024 for chest pain and SOB.   Patient Report:  Patient states she presented due to medical complications related to recent homelessness. She reports previously being in a rehabilitation facility in [place], [place] for polysubstance abuse, then moving back to [place], [place] to assist with "family issues." It is unclear whether she initially stayed in a hotel or with her daughter, but she ultimately became displaced and has been homeless for the past 2-3 weeks.  She reports not having eaten or had water for several days prior to admission. She endorses low mood, decreased energy, poor concentration, low appetite, and poor sleep, attributing these symptoms to her current situation. She reports some anxiety. She denies AVH and paranoia.   Patient endorses vague suicidal ideations, repeatedly stating I'm not sure if I'll be safe when I leave, maybe I should just die while shrugging. When asked again if she wants to kill herself she states What is there to live for? My daughters don't want me. I don't have anywhere to go. I'll just go back to the abandon houses I guess. When asked if she feels as though she would be safe if she left the hospital, she states My mood changes day by day. When seen again later in the day, patient  is with sister and denies SI, stating she would rather go to a rehab program.   She shares she has four daughters and nine granddaughters, and moved to the area to help her family. However, she states that when she contacted her eldest daughter upon arrival to the hospital, she was told, "It  is your fault you are in the hospital," and was denied shelter. When planning discharge for patient, she called her daughter again who denied her shelter once more. After this phone call, patient became tearful and endorsed multiple suicidal ideations to nursing staff.    ROS:  As above  Collateral information:  none  Psychiatric History:  Information collected from patient, EMR Previous psychiatric diagnoses: Depression, Anxiety Prior psychiatric treatment: Duloxetine  and Buspirone  Psychiatric medication compliance history: Not compliant  Current psychiatric treatment: None Current psychiatrist: None Current therapist: None  Previous hospitalizations 2022: [facility] History of suicide attempts: 1 prior suicide attempt at age 77 by overdose on pills after a break-up with a boyfriend  History of self harm: Denies   Family psych history:  Niece: Mood disorder Brother: Substance abuse   Social History:  As above   Family History:  The patient's family history is not on file.  Medical History: Past Medical History:  Diagnosis Date   COPD without exacerbation (HCC) 03/01/2007   Qualifier: Diagnosis of  By: Armida MD, Curtis     Coronary artery disease involving native coronary artery of native heart 04/22/2021   Nicotine dependence, cigarettes, uncomplicated 04/22/2021   Polysubstance abuse (HCC) 04/22/2021    Surgical History: History reviewed. No pertinent surgical history.  Medications:   Current Facility-Administered Medications:    acetaminophen  (TYLENOL ) tablet 650 mg, 650 mg, Oral, Q6H PRN, 650 mg at 05/07/24 1147 **OR** acetaminophen  (TYLENOL ) suppository 650 mg, 650 mg, Rectal, Q6H PRN, Sim, Mohammad L, MD   albuterol  (PROVENTIL ) (2.5 MG/3ML) 0.083% nebulizer solution 2.5 mg, 2.5 mg, Inhalation, Q6H PRN, Sim Emery CROME, MD   busPIRone  (BUSPAR ) tablet 5 mg, 5 mg, Oral, BID, Sreeram, Narendranath, MD, 5 mg at 05/08/24 1116   calcium  carbonate (dosed in mg elemental  calcium ) suspension 500 mg of elemental calcium , 500 mg of elemental calcium , Oral, Q6H PRN, Sim Emery CROME, MD   camphor-menthol  (SARNA) lotion 1 Application, 1 Application, Topical, Q8H PRN **AND** hydrOXYzine  (ATARAX ) tablet 25 mg, 25 mg, Oral, Q8H PRN, Sim, Mohammad L, MD   dextrose  5 % and 0.9 % NaCl with KCl 20 mEq/L infusion, , Intravenous, Continuous, Sreeram, Narendranath, MD, Last Rate: 40 mL/hr at 05/08/24 1122, New Bag at 05/08/24 1122   docusate sodium  (ENEMEEZ) enema 283 mg, 1 enema, Rectal, PRN, Sim, Mohammad L, MD   feeding supplement (NEPRO CARB STEADY) liquid 237 mL, 237 mL, Oral, TID PRN, Sim, Mohammad L, MD   heparin  injection 5,000 Units, 5,000 Units, Subcutaneous, Q8H, Garba, Mohammad L, MD, 5,000 Units at 05/08/24 0553   ondansetron  (ZOFRAN ) tablet 4 mg, 4 mg, Oral, Q6H PRN, 4 mg at 05/07/24 2201 **OR** ondansetron  (ZOFRAN ) injection 4 mg, 4 mg, Intravenous, Q6H PRN, Garba, Mohammad L, MD   sorbitol  70 % solution 30 mL, 30 mL, Oral, PRN, Garba, Mohammad L, MD  Allergies: Allergies  Allergen Reactions   Cymbalta  [Duloxetine  Hcl] Other (See Comments)    Hallucinations; psychosis       Objective  Vital signs:  Temp:  [98.3 F (36.8 C)-98.9 F (37.2 C)] 98.3 F (36.8 C) (09/24 0743) Pulse Rate:  [67-78] 72 (09/24 0743) Resp:  [15-17] 17 (  09/24 0518) BP: (128-150)/(75-82) 128/82 (09/24 0743) SpO2:  [98 %-100 %] 98 % (09/24 0743)  Psychiatric Specialty Exam: Physical Exam Constitutional:      Appearance: the patient is not toxic-appearing.  Pulmonary:     Effort: Pulmonary effort is normal.  Neurological:     General: No focal deficit present.     Mental Status: the patient is alert and oriented to person, place, and time.   Review of Systems  Respiratory:  Negative for shortness of breath.   Cardiovascular:  Negative for chest pain Gastrointestinal:  Negative for abdominal pain, diarrhea, nausea. Negative for constipation and vomiting.   Neurological:  Negative for headaches.      BP 128/82   Pulse 72   Temp 98.3 F (36.8 C) (Oral)   Resp 17   SpO2 98%   General Appearance: Tearful  Eye Contact:  Poor  Speech:  Clear and Coherent  Volume:  Normal  Mood:  It changes day by day  Affect:  Congruent  Thought Process:  Coherent  Orientation:  Full (Time, Place, and Person)  Thought Content: Logical   Suicidal Thoughts:  Conditional, ultimately denies  Homicidal Thoughts:  Denies  Memory:  Immediate;   Good  Judgement:  fair  Insight:  fair  Psychomotor Activity:  Normal  Concentration:  Concentration: Good  Recall:  Good  Fund of Knowledge: Good  Language: Good  Akathisia:  No  Handed:    AIMS (if indicated): not done  Assets:  Communication Skills Desire for Improvement  ADL's:  Intact  Cognition: WNL  Sleep:  Fair   Alan Maiden, MD PGY-1

## 2024-05-08 NOTE — Progress Notes (Signed)
 Progress Note   Patient: Carrie Adkins FMW:[id number] DOB: [birth year] DOA: 05/05/2024     3 DOS: the patient was seen and examined on 05/08/2024   Brief hospital course: Carrie Adkins is a 58 y.o. female with medical history significant of polysubstance abuse, cocaine abuse and tobacco abuse, remote nonobstructive coronary artery disease, COPD, depression with previous suicidal ideation, essential hypertension, hyperlipidemia who presented to the ER from an abandoned house complaint of chest pain shortness of breath.   Workup in the ER showed significant abnormalities including a creatinine of 5.6, hypokalemia and hyponatremia.  Patient appears dehydrated, not eaten for several days. Admitted to TRH service for further management and evaluation.   05/06/24 She did have suicidal ideations, one to one sitter and psychiatry consulted. 9/23//25  - psych evaluated, not suicidal, can be off 1:1. 05/08/24 - kidney function better. Psychiatry follow up advised inpatient psych placement.  Assessment and Plan: AKI:  Prerenal in the setting of poor oral intake, food insecurity. Possible chronic component and on top takes ACEI. Patient eating and drinking poor for many days, was found in an abandoned house. She got IV fluid boluses. Continue maintenance fluids with D5, kcl for another day. Renal function improving. Follow daily renal function. Avoid nephrotoxic drugs. Renal ultrasound showed suspected medical renal disease. Outpatient nephrology follow up will be arranged upon discharge.   Hypokalemia: Continue to replete potassium and recheck.   Polysubstance abuse:  Discussed ill effects of illicit drugs. Resources will be provided by social work   Major depressive disorder:  Suicidal ideations- Followed by Psychiatry, still depressed has psychosocial stressors and at risk for self harm. No need for IVC OR 1:1 per psychiatry MD. She will need inpatient psychiatry bed placement.  (Voluntary admission to [facility]) Continue home dose Buspar .   GERD: Continues PPIs   COPD: No acute exacerbation.  Homeless- TOC to provide resources.        Out of bed to chair. Incentive spirometry. Nursing supportive care. Fall, aspiration precautions. Diet:  Diet Orders (From admission, onward)     Start     Ordered   05/06/24 0017  Diet renal/carb modified with fluid restriction Diet-HS Snack? Nothing; Fluid restriction: 1200 mL Fluid; Room service appropriate? Yes; Fluid consistency: Thin  Diet effective now       Question Answer Comment  Diet-HS Snack? Nothing   Fluid restriction: 1200 mL Fluid   Room service appropriate? Yes   Fluid consistency: Thin      05/06/24 0016           DVT prophylaxis: heparin  injection 5,000 Units Start: 05/06/24 0030  Level of care: Telemetry Medical   Code Status: Full Code  Subjective: Patient is seen and examined today morning. She is depressed, tearful. Abdominal pain better. Diarrhea improved.  Physical Exam: Vitals:   05/07/24 2030 05/08/24 0518 05/08/24 0743 05/08/24 1430  BP: 136/75 (!) 150/81 128/82 131/84  Pulse: 78 67 72 65  Resp: 15 17    Temp: 98.9 F (37.2 C) 98.4 F (36.9 C) 98.3 F (36.8 C) 97.7 F (36.5 C)  TempSrc:   Oral   SpO2: 99% 100% 98% 100%    General - Middle aged [place] American female, no distress. HEENT - PERRLA, EOMI, atraumatic head, non tender sinuses. Lung - Clear, no rales, rhonchi, wheezes. Heart - S1, S2 heard, no murmurs, rubs, trace pedal edema. Abdomen - Soft, lower abdomen tender, no guarding, bowel sounds good Neuro - Alert, awake and oriented x 3, non  focal exam. Skin - Warm and dry.  Data Reviewed:      Latest Ref Rng & Units 05/06/2024    2:51 AM 05/05/2024    7:52 PM 04/25/2021   12:45 PM  CBC  WBC 4.0 - 10.5 K/uL 7.2  7.9  6.0   Hemoglobin 12.0 - 15.0 g/dL 86.0  84.0  87.5   Hematocrit 36.0 - 46.0 % 42.1  47.1  37.7   Platelets 150 - 400 K/uL 377  494  325        Latest Ref Rng & Units 05/08/2024    4:13 AM 05/07/2024    4:20 AM 05/06/2024    2:51 AM  BMP  Glucose 70 - 99 mg/dL 90  87  848   BUN 6 - 20 mg/dL 30  51  79   Creatinine 0.44 - 1.00 mg/dL 8.19  7.48  5.13    5.27   Sodium 135 - 145 mmol/L 140  138  133   Potassium 3.5 - 5.1 mmol/L 3.6  4.0  3.1   Chloride 98 - 111 mmol/L 109  109  101   CO2 22 - 32 mmol/L 21  18  16    Calcium  8.9 - 10.3 mg/dL 8.7  8.6  8.8    No results found.   Family Communication: Discussed with patient, understand and agree. All questions answered.  Disposition: Status is: Inpatient Remains inpatient appropriate because: AKI, homeless, inpatient psych bed  Planned Discharge Destination: [facility] placement     Time spent: 44 minutes  Author: Concepcion Riser, MD 05/08/2024 2:51 PM Secure chat 7am to 7pm For on call review [email or web address].

## 2024-05-08 NOTE — Plan of Care (Signed)
  Problem: Education: Goal: Knowledge of General Education information will improve Description: Including pain rating scale, medication(s)/side effects and non-pharmacologic comfort measures Outcome: Progressing   Problem: Clinical Measurements: Goal: Will remain free from infection Outcome: Progressing Goal: Respiratory complications will improve Outcome: Progressing   Problem: Activity: Goal: Risk for activity intolerance will decrease Outcome: Progressing   Problem: Skin Integrity: Goal: Risk for impaired skin integrity will decrease Outcome: Progressing   

## 2024-05-08 NOTE — Progress Notes (Signed)
 Patient tearful, stating the doctor is releasing her tomorrow and she has nowhere to go. States family, doesn't want her. Concerns surrounding discharge. SW aware.

## 2024-05-08 NOTE — Progress Notes (Signed)
 CSW met with pt and her sister Grayson Molt to discuss substance abuse resources and also SDOH: food, housing, transportation.  Upon entering, they informed CSW that pt has just spoken with staff at a sober living house in [place] [facility]) about a potential return to that program.  Pt was there earlier this year, left in August, relapsed, and ended up in her current situation, homeless in [place].  The program will require her to complete a residential substance abuse program and is recommending [facility].  Pt reports she was at [facility] in March of this year, prior to her admission to the sober living house, she achieved 6 months sobriety prior to her relapse.  Psych team at [facility] is recommending inpt psych, which would need to occur prior to referral to [facility].  SDOH discussed: Food: pt does have food stamps through [facility]: sober living program does work towards permanent housing after completion of the program. Transportation: pt has medicaid transportation in place, also in [place].    CSW informed after leaving the room that psych team is requesting immediate referral to [facility], as pt safe for the above DC plan of [facility] to sober living house.  CSW spoke with Hospital doctor at [facility], she requested referral be faxed to [phone], which was done.  Pt can call at 4pm for phone screening.  Pt updated, she will call [facility] at 4pm for phone screening.  Cathlyn Ferry, MSW, LCSW 9/24/20253:37 PM

## 2024-05-09 DIAGNOSIS — N179 Acute kidney failure, unspecified: Secondary | ICD-10-CM | POA: Diagnosis not present

## 2024-05-09 DIAGNOSIS — F1910 Other psychoactive substance abuse, uncomplicated: Secondary | ICD-10-CM | POA: Diagnosis not present

## 2024-05-09 DIAGNOSIS — F17200 Nicotine dependence, unspecified, uncomplicated: Secondary | ICD-10-CM | POA: Diagnosis not present

## 2024-05-09 DIAGNOSIS — F331 Major depressive disorder, recurrent, moderate: Secondary | ICD-10-CM | POA: Diagnosis not present

## 2024-05-09 LAB — BASIC METABOLIC PANEL WITH GFR
Anion gap: 9 (ref 5–15)
BUN: 20 mg/dL (ref 6–20)
CO2: 20 mmol/L — ABNORMAL LOW (ref 22–32)
Calcium: 8.5 mg/dL — ABNORMAL LOW (ref 8.9–10.3)
Chloride: 108 mmol/L (ref 98–111)
Creatinine, Ser: 1.45 mg/dL — ABNORMAL HIGH (ref 0.44–1.00)
GFR, Estimated: 41 mL/min — ABNORMAL LOW (ref >60)
Glucose, Bld: 90 mg/dL (ref 70–99)
Potassium: 3.6 mmol/L (ref 3.5–5.1)
Sodium: 137 mmol/L (ref 135–145)

## 2024-05-09 NOTE — TOC Progression Note (Addendum)
 Transition of Care [facility]) - Progression Note    Patient Details  Name: Carrie Adkins MRN: [id number] Date of Birth: 1965-10-22  Transition of Care [facility]) CM/SW Contact  Bridget Cordella Simmonds, LCSW Phone Number: 05/09/2024, 11:37 AM  Clinical Narrative:   CSW spoke with pt several times this AM, she has called [facility] several times, keeps getting transferred for her phone screening and no on answers.  CSW called [facility], was informed that the intake worker is waiting on something from [facility] admin before she can call pt back to complete the phone screening.  CSW confirmed they have pt hospital room phone number.   1400: CSW spoke with pt, she did complete phone screening, they have to talk with admin to see if she can readmit since she was just there in March 2025.                    Expected Discharge Plan and Services                                               Social Drivers of Health (SDOH) Interventions SDOH Screenings   Food Insecurity: Food Insecurity Present (05/06/2024)  Housing: High Risk (05/06/2024)  Transportation Needs: Unmet Transportation Needs (05/06/2024)  Utilities: Not At Risk (05/06/2024)  Alcohol Screen: Low Risk  (04/29/2021)  Tobacco Use: High Risk (05/05/2024)    Readmission Risk Interventions     No data to display

## 2024-05-09 NOTE — Progress Notes (Signed)
 Progress Note   Patient: Carrie Adkins FMW:[id number] DOB: 1966-02-01 DOA: 05/05/2024     4 DOS: the patient was seen and examined on 05/09/2024   Brief hospital course: Carrie Adkins is a 58 y.o. female with medical history significant of polysubstance abuse, cocaine abuse and tobacco abuse, remote nonobstructive coronary artery disease, COPD, depression with previous suicidal ideation, essential hypertension, hyperlipidemia who presented to the ER from an abandoned house complaint of chest pain shortness of breath.   Workup in the ER showed significant abnormalities including a creatinine of 5.6, hypokalemia and hyponatremia.  Patient appears dehydrated, not eaten for several days. Admitted to TRH service for further management and evaluation.   05/06/24 She did have suicidal ideations, one to one sitter and psychiatry consulted. 9/23//25  - psych evaluated, not suicidal, can be off 1:1. 05/08/24 - kidney function better. Psychiatry follow up advised inpatient psych placement.  Assessment and Plan: AKI:  Prerenal in the setting of poor oral intake, food insecurity. Possible chronic component and on top takes ACEI. Patient eating and drinking poor for many days, was found in an abandoned house. Renal function improved with IV hydration.  Follow daily renal function. Avoid nephrotoxic drugs. Renal ultrasound showed suspected medical renal disease. Outpatient nephrology follow up will be arranged upon discharge.   Hypokalemia:  Improved with repletion.   Polysubstance abuse:  Discussed ill effects of illicit drugs. Resources will be provided by social work. She wishes to go rehab. TOC helping her.   Major depressive disorder:  Suicidal ideations- Followed by Psychiatry. No need for IVC OR 1:1 per psychiatry MD. Continue home dose Buspar . Patient wishes to go drug rehab program.   GERD: Continues PPIs   COPD: No acute exacerbation.  Homeless- TOC to provide  resources.        Out of bed to chair. Incentive spirometry. Nursing supportive care. Fall, aspiration precautions. Diet:  Diet Orders (From admission, onward)     Start     Ordered   05/09/24 1005  Diet regular Room service appropriate? Yes; Fluid consistency: Thin  Diet effective now       Question Answer Comment  Room service appropriate? Yes   Fluid consistency: Thin      05/09/24 1004           DVT prophylaxis: heparin  injection 5,000 Units Start: 05/06/24 0030  Level of care: Telemetry Medical   Code Status: Full Code  Subjective: Patient is seen and examined today morning. She is better today, awaiting call from rehab. Abdominal pain better. Diarrhea improved.  Physical Exam: Vitals:   05/09/24 0450 05/09/24 0500 05/09/24 0831 05/09/24 1529  BP: (!) 143/85  136/71 133/77  Pulse: 64  73 65  Resp: 17  16 16   Temp: 98.4 F (36.9 C)  98.3 F (36.8 C) 98.6 F (37 C)  TempSrc:   Oral Oral  SpO2: 100%  99% 100%  Weight:  95.5 kg      General - Middle aged [place] American female, no distress. HEENT - PERRLA, EOMI, atraumatic head, non tender sinuses. Lung - Clear, no rales, rhonchi, wheezes. Heart - S1, S2 heard, no murmurs, rubs, trace pedal edema. Abdomen - Soft, non tender, no guarding, bowel sounds good Neuro - Alert, awake and oriented x 3, non focal exam. Skin - Warm and dry.  Data Reviewed:      Latest Ref Rng & Units 05/06/2024    2:51 AM 05/05/2024    7:52 PM 04/25/2021   12:45  PM  CBC  WBC 4.0 - 10.5 K/uL 7.2  7.9  6.0   Hemoglobin 12.0 - 15.0 g/dL 86.0  84.0  87.5   Hematocrit 36.0 - 46.0 % 42.1  47.1  37.7   Platelets 150 - 400 K/uL 377  494  325       Latest Ref Rng & Units 05/09/2024    5:52 AM 05/08/2024    4:13 AM 05/07/2024    4:20 AM  BMP  Glucose 70 - 99 mg/dL 90  90  87   BUN 6 - 20 mg/dL 20  30  51   Creatinine 0.44 - 1.00 mg/dL 8.54  8.19  7.48   Sodium 135 - 145 mmol/L 137  140  138   Potassium 3.5 - 5.1 mmol/L 3.6  3.6   4.0   Chloride 98 - 111 mmol/L 108  109  109   CO2 22 - 32 mmol/L 20  21  18    Calcium  8.9 - 10.3 mg/dL 8.5  8.7  8.6    No results found.   Family Communication: Discussed with patient, understand and agree. All questions answered.  Disposition: Status is: Inpatient Remains inpatient appropriate because: AKI, homeless, drug rehab  Planned Discharge Destination: Rehab     Time spent: 42 minutes  Author: Concepcion Riser, MD 05/09/2024 4:58 PM Secure chat 7am to 7pm For on call review [email or web address].

## 2024-05-10 DIAGNOSIS — F17200 Nicotine dependence, unspecified, uncomplicated: Secondary | ICD-10-CM | POA: Diagnosis not present

## 2024-05-10 DIAGNOSIS — F1910 Other psychoactive substance abuse, uncomplicated: Secondary | ICD-10-CM | POA: Diagnosis not present

## 2024-05-10 DIAGNOSIS — N179 Acute kidney failure, unspecified: Secondary | ICD-10-CM | POA: Diagnosis not present

## 2024-05-10 DIAGNOSIS — F331 Major depressive disorder, recurrent, moderate: Secondary | ICD-10-CM | POA: Diagnosis not present

## 2024-05-10 LAB — BASIC METABOLIC PANEL WITH GFR
Anion gap: 11 (ref 5–15)
BUN: 14 mg/dL (ref 6–20)
CO2: 20 mmol/L — ABNORMAL LOW (ref 22–32)
Calcium: 8.6 mg/dL — ABNORMAL LOW (ref 8.9–10.3)
Chloride: 108 mmol/L (ref 98–111)
Creatinine, Ser: 1.39 mg/dL — ABNORMAL HIGH (ref 0.44–1.00)
GFR, Estimated: 43 mL/min — ABNORMAL LOW (ref >60)
Glucose, Bld: 98 mg/dL (ref 70–99)
Potassium: 3.8 mmol/L (ref 3.5–5.1)
Sodium: 139 mmol/L (ref 135–145)

## 2024-05-10 NOTE — Progress Notes (Signed)
 Progress Note   Patient: Carrie Adkins FMW:[id number] DOB: Mar 18, 1966 DOA: 05/05/2024     5 DOS: the patient was seen and examined on 05/10/2024   Brief hospital course: Carrie Adkins is a 58 y.o. female with medical history significant of polysubstance abuse, cocaine abuse and tobacco abuse, remote nonobstructive coronary artery disease, COPD, depression with previous suicidal ideation, essential hypertension, hyperlipidemia who presented to the ER from an abandoned house complaint of chest pain shortness of breath.   Workup in the ER showed significant abnormalities including a creatinine of 5.6, hypokalemia and hyponatremia.  Patient appears dehydrated, not eaten for several days. Admitted to TRH service for further management and evaluation.   05/06/24 She did have suicidal ideations, one to one sitter and psychiatry consulted. 9/23//25  - psych evaluated, not suicidal, can be off 1:1. 05/08/24 - kidney function better. Psychiatry follow up advised inpatient psych placement.  Assessment and Plan: AKI:  Prerenal in the setting of poor oral intake, food insecurity.  Possible chronic component, on top takes ACEI. Today Creatinine improved to 1.39 from her presentation fo 5.6. The current kidney function is likely her baseline. (Last normal kidney function form 2022). Outpatient follow up with PCP, nephrology suggested. Will hold ACEI.   Hypokalemia:  Improved with repletion.   Polysubstance abuse:  Discussed ill effects of illicit drugs. Resources will be provided by social work. She wishes to go rehab. TOC helping her.   Major depressive disorder:  Suicidal ideations- Followed by Psychiatry. No need for IVC OR 1:1 per psychiatry MD. Continue home dose Buspar . Patient wishes to go drug rehab program.   GERD: Continues PPIs   COPD: No acute exacerbation.  Homeless- TOC to provide resources. Dc plan to drug rehab.     Out of bed to chair. Incentive  spirometry. Nursing supportive care. Fall, aspiration precautions. Diet:  Diet Orders (From admission, onward)     Start     Ordered   05/09/24 1005  Diet regular Room service appropriate? Yes; Fluid consistency: Thin  Diet effective now       Question Answer Comment  Room service appropriate? Yes   Fluid consistency: Thin      05/09/24 1004           DVT prophylaxis: heparin  injection 5,000 Units Start: 05/06/24 0030  Level of care: Telemetry Medical   Code Status: Full Code  Subjective: Patient is seen and examined today morning. She is feeling much better today, awaiting call from rehab. Denies abdominal pain, nausea or diarrhea. Getting out of bed.  Physical Exam: Vitals:   05/09/24 1936 05/10/24 0500 05/10/24 0809 05/10/24 1500  BP: (!) 151/82 (!) 149/91 (!) 142/86 137/87  Pulse: 64 80 72 66  Resp: 16 16 16 16   Temp: 98.1 F (36.7 C) 98.4 F (36.9 C) 98 F (36.7 C) 98.3 F (36.8 C)  TempSrc: Oral Oral Oral Oral  SpO2: 99% 100% 97% 98%  Weight:        General - Middle aged [place] American female, no distress. HEENT - PERRLA, EOMI, atraumatic head, non tender sinuses. Lung - Clear, no rales, rhonchi, wheezes. Heart - S1, S2 heard, no murmurs, rubs, trace pedal edema. Abdomen - Soft, non tender, no guarding, bowel sounds good Neuro - Alert, awake and oriented x 3, non focal exam. Skin - Warm and dry.  Data Reviewed:      Latest Ref Rng & Units 05/06/2024    2:51 AM 05/05/2024    7:52 PM 04/25/2021  12:45 PM  CBC  WBC 4.0 - 10.5 K/uL 7.2  7.9  6.0   Hemoglobin 12.0 - 15.0 g/dL 86.0  84.0  87.5   Hematocrit 36.0 - 46.0 % 42.1  47.1  37.7   Platelets 150 - 400 K/uL 377  494  325       Latest Ref Rng & Units 05/10/2024    3:35 AM 05/09/2024    5:52 AM 05/08/2024    4:13 AM  BMP  Glucose 70 - 99 mg/dL 98  90  90   BUN 6 - 20 mg/dL 14  20  30    Creatinine 0.44 - 1.00 mg/dL 8.60  8.54  8.19   Sodium 135 - 145 mmol/L 139  137  140   Potassium 3.5 - 5.1  mmol/L 3.8  3.6  3.6   Chloride 98 - 111 mmol/L 108  108  109   CO2 22 - 32 mmol/L 20  20  21    Calcium  8.9 - 10.3 mg/dL 8.6  8.5  8.7    No results found.   Family Communication: Discussed with patient, understand and agree. All questions answered.  Disposition: Status is: Inpatient Remains inpatient appropriate because: homeless, drug rehab  Planned Discharge Destination: Rehab     Time spent: 41 minutes  Author: Concepcion Riser, MD 05/10/2024 4:03 PM Secure chat 7am to 7pm For on call review [email or web address].

## 2024-05-10 NOTE — TOC Progression Note (Addendum)
 Transition of Care [facility]) - Progression Note    Patient Details  Name: Carrie Adkins MRN: [id number] Date of Birth: 1966/01/17  Transition [facility] Care [facility]) CM/SW Contact  Bridget Cordella Simmonds, LCSW Phone Number: 05/10/2024, 8:31 AM  Clinical Narrative:   CSW spoke with [facility], they are still waiting for a determination from supervisor if pt will be accepted for admission.  Hoping to have decision before noon today.    1400: CSW spoke with Lisa/[facility].  She asked that pt call and speak with Bernarda at [facility] in 10 minutes, pt provided phone number.    1420: CSW spoke with pt again, she has called several times, keeps getting transferred into a voicemail.  She called again with CSW present, she informed them of this, and was, again, transferred into a voicemail, where she left an appropriate message.   1600: Per pt, [facility] requesting additional records related to stable kidney function: MD note, labs faxed to [facility]. CSW LM with Bernarda at [facility] asking for update.                Expected Discharge Plan and Services                                               Social Drivers of Health (SDOH) Interventions SDOH Screenings   Food Insecurity: Food Insecurity Present (05/06/2024)  Housing: High Risk (05/06/2024)  Transportation Needs: Unmet Transportation Needs (05/06/2024)  Utilities: Not At Risk (05/06/2024)  Alcohol Screen: Low Risk  (04/29/2021)  Tobacco Use: High Risk (05/05/2024)    Readmission Risk Interventions     No data to display

## 2024-05-11 DIAGNOSIS — F17200 Nicotine dependence, unspecified, uncomplicated: Secondary | ICD-10-CM | POA: Diagnosis not present

## 2024-05-11 DIAGNOSIS — F331 Major depressive disorder, recurrent, moderate: Secondary | ICD-10-CM | POA: Diagnosis not present

## 2024-05-11 DIAGNOSIS — F1910 Other psychoactive substance abuse, uncomplicated: Secondary | ICD-10-CM | POA: Diagnosis not present

## 2024-05-11 DIAGNOSIS — N179 Acute kidney failure, unspecified: Secondary | ICD-10-CM | POA: Diagnosis not present

## 2024-05-11 LAB — BASIC METABOLIC PANEL WITH GFR
Anion gap: 10 (ref 5–15)
BUN: 12 mg/dL (ref 6–20)
CO2: 22 mmol/L (ref 22–32)
Calcium: 8.7 mg/dL — ABNORMAL LOW (ref 8.9–10.3)
Chloride: 109 mmol/L (ref 98–111)
Creatinine, Ser: 1.37 mg/dL — ABNORMAL HIGH (ref 0.44–1.00)
GFR, Estimated: 44 mL/min — ABNORMAL LOW (ref >60)
Glucose, Bld: 88 mg/dL (ref 70–99)
Potassium: 4 mmol/L (ref 3.5–5.1)
Sodium: 141 mmol/L (ref 135–145)

## 2024-05-11 NOTE — Progress Notes (Signed)
 Progress Note   Patient: Carrie Adkins FMW:[id number] DOB: 25-Dec-1965 DOA: 05/05/2024     6 DOS: the patient was seen and examined on 05/11/2024   Brief hospital course: Carrie Adkins is a 58 y.o. female with medical history significant of polysubstance abuse, cocaine abuse and tobacco abuse, remote nonobstructive coronary artery disease, COPD, depression with previous suicidal ideation, essential hypertension, hyperlipidemia who presented to the ER from an abandoned house complaint of chest pain shortness of breath.   Workup in the ER showed significant abnormalities including a creatinine of 5.6, hypokalemia and hyponatremia.  Patient appears dehydrated, not eaten for several days. Admitted to TRH service for further management and evaluation.   05/06/24 She did have suicidal ideations, one to one sitter and psychiatry consulted. 9/23//25  - psych evaluated, not suicidal, can be off 1:1. 05/08/24 - kidney function better. Psychiatry follow up advised inpatient psych placement.  Assessment and Plan: AKI:  Prerenal in the setting of poor oral intake, food insecurity.  Possible chronic component, on top takes ACEI. Today Creatinine improved to 1.37 from her presentation fo 5.6. The current kidney function is likely her baseline. (Last normal kidney function form 2022). Outpatient follow up with PCP, nephrology suggested. Will hold ACEI.   Hypokalemia:  Improved with repletion.   Polysubstance abuse:  Discussed ill effects of illicit drugs. Resources will be provided by social work. She wishes to go rehab. TOC helping her.   Major depressive disorder:  Suicidal ideations- Followed by Psychiatry. No need for IVC OR 1:1 per psychiatry MD. Continue home dose Buspar . Patient wishes to go drug rehab program.   GERD: Continues PPIs   COPD: No acute exacerbation.  Homeless- TOC to provide resources. Dc plan to drug rehab.     Out of bed to chair. Incentive  spirometry. Nursing supportive care. Fall, aspiration precautions. Diet:  Diet Orders (From admission, onward)     Start     Ordered   05/09/24 1005  Diet regular Room service appropriate? Yes; Fluid consistency: Thin  Diet effective now       Question Answer Comment  Room service appropriate? Yes   Fluid consistency: Thin      05/09/24 1004           DVT prophylaxis: heparin  injection 5,000 Units Start: 05/06/24 0030  Level of care: Telemetry Medical   Code Status: Full Code  Subjective: Patient is seen and examined today morning. She is awaiting rehab bed. No overnight issues or complaints. Eating fair.  Physical Exam: Vitals:   05/10/24 1941 05/11/24 0409 05/11/24 0800 05/11/24 1457  BP: (!) 147/94 (!) 160/91 (!) 147/82 (!) 163/84  Pulse: 61 77 66 61  Resp: 15 15 16 16   Temp: 98.9 F (37.2 C) 98.4 F (36.9 C) 98.7 F (37.1 C) 98.2 F (36.8 C)  TempSrc: Oral Oral Oral Oral  SpO2: 99% 100% 99% 100%  Weight:        General - Middle aged [place] female, no distress. HEENT - PERRLA, EOMI, atraumatic head, non tender sinuses. Lung - Clear, no rales, rhonchi, wheezes. Heart - S1, S2 heard, no murmurs, rubs, trace pedal edema. Abdomen - Soft, non tender, no guarding, bowel sounds good Neuro - Alert, awake and oriented x 3, non focal exam. Skin - Warm and dry.  Data Reviewed:      Latest Ref Rng & Units 05/06/2024    2:51 AM 05/05/2024    7:52 PM 04/25/2021   12:45 PM  CBC  WBC 4.0 - 10.5 K/uL 7.2  7.9  6.0   Hemoglobin 12.0 - 15.0 g/dL 86.0  84.0  87.5   Hematocrit 36.0 - 46.0 % 42.1  47.1  37.7   Platelets 150 - 400 K/uL 377  494  325       Latest Ref Rng & Units 05/11/2024    6:04 AM 05/10/2024    3:35 AM 05/09/2024    5:52 AM  BMP  Glucose 70 - 99 mg/dL 88  98  90   BUN 6 - 20 mg/dL 12  14  20    Creatinine 0.44 - 1.00 mg/dL 8.62  8.60  8.54   Sodium 135 - 145 mmol/L 141  139  137   Potassium 3.5 - 5.1 mmol/L 4.0  3.8  3.6   Chloride 98 - 111  mmol/L 109  108  108   CO2 22 - 32 mmol/L 22  20  20    Calcium  8.9 - 10.3 mg/dL 8.7  8.6  8.5    No results found.   Family Communication: Discussed with patient, understand and agree. All questions answered.  Disposition: Status is: Inpatient Remains inpatient appropriate because: homeless, drug rehab  Planned Discharge Destination: Rehab     Time spent: 39 minutes  Author: Concepcion Riser, MD 05/11/2024 5:56 PM Secure chat 7am to 7pm For on call review [email or web address].

## 2024-05-11 NOTE — TOC Progression Note (Signed)
 Transition of Care [facility]) - Progression Note    Patient Details  Name: Carrie Adkins MRN: [id number] Date of Birth: [birth year]  Transition of Care [facility]) CM/SW Contact  Bridget Cordella Simmonds, LCSW Phone Number: 05/11/2024, 8:26 AM  Clinical Narrative:   CSW message from Alicia/[facility]: pt has been accepted but will not be able to admit until Monday.  Will need to coordinate the admission on Monday with Joen, ext [phone]                      Expected Discharge Plan and Services                                               Social Drivers of Health (SDOH) Interventions SDOH Screenings   Food Insecurity: Food Insecurity Present (05/06/2024)  Housing: High Risk (05/06/2024)  Transportation Needs: Unmet Transportation Needs (05/06/2024)  Utilities: Not At Risk (05/06/2024)  Alcohol Screen: Low Risk  (04/29/2021)  Tobacco Use: High Risk (05/05/2024)    Readmission Risk Interventions     No data to display

## 2024-05-12 DIAGNOSIS — F331 Major depressive disorder, recurrent, moderate: Secondary | ICD-10-CM | POA: Diagnosis not present

## 2024-05-12 DIAGNOSIS — F1910 Other psychoactive substance abuse, uncomplicated: Secondary | ICD-10-CM | POA: Diagnosis not present

## 2024-05-12 DIAGNOSIS — F17200 Nicotine dependence, unspecified, uncomplicated: Secondary | ICD-10-CM | POA: Diagnosis not present

## 2024-05-12 DIAGNOSIS — N179 Acute kidney failure, unspecified: Secondary | ICD-10-CM | POA: Diagnosis not present

## 2024-05-12 LAB — BASIC METABOLIC PANEL WITH GFR
Anion gap: 8 (ref 5–15)
BUN: 16 mg/dL (ref 6–20)
CO2: 23 mmol/L (ref 22–32)
Calcium: 8.5 mg/dL — ABNORMAL LOW (ref 8.9–10.3)
Chloride: 110 mmol/L (ref 98–111)
Creatinine, Ser: 1.41 mg/dL — ABNORMAL HIGH (ref 0.44–1.00)
GFR, Estimated: 43 mL/min — ABNORMAL LOW (ref >60)
Glucose, Bld: 85 mg/dL (ref 70–99)
Potassium: 4 mmol/L (ref 3.5–5.1)
Sodium: 141 mmol/L (ref 135–145)

## 2024-05-12 NOTE — Plan of Care (Signed)
  Problem: Health Behavior/Discharge Planning: Goal: Ability to manage health-related needs will improve Outcome: Progressing   Problem: Clinical Measurements: Goal: Ability to maintain clinical measurements within normal limits will improve Outcome: Progressing   Problem: Safety: Goal: Ability to remain free from injury will improve Outcome: Progressing   

## 2024-05-12 NOTE — Progress Notes (Signed)
 Progress Note   Patient: Carrie Adkins FMW:[id number] DOB: 1966-07-17 DOA: 05/05/2024     7 DOS: the patient was seen and examined on 05/12/2024   Brief hospital course: Carrie Adkins is a 58 y.o. female with medical history significant of polysubstance abuse, cocaine abuse and tobacco abuse, remote nonobstructive coronary artery disease, COPD, depression with previous suicidal ideation, essential hypertension, hyperlipidemia who presented to the ER from an abandoned house complaint of chest pain shortness of breath.   Workup in the ER showed significant abnormalities including a creatinine of 5.6, hypokalemia and hyponatremia.  Patient appears dehydrated, not eaten for several days. Admitted to TRH service for further management and evaluation.   05/06/24 She did have suicidal ideations, one to one sitter and psychiatry consulted. 9/23//25  - psych evaluated, not suicidal, can be off 1:1. 05/08/24 - kidney function better. Psychiatry follow up advised inpatient psych placement.  Assessment and Plan: AKI:  Prerenal in the setting of poor oral intake, food insecurity.  Possible chronic component, on top takes ACEI. Today Creatinine improved to 1.37 from her presentation fo 5.6. The current kidney function is likely her baseline. (Last normal kidney function form 2022). Outpatient follow up with PCP, nephrology suggested. Will hold ACEI.   Hypokalemia:  Improved with repletion.   Polysubstance abuse:  Discussed ill effects of illicit drugs. Resources will be provided by social work. She wishes to go rehab. TOC helping her.   Major depressive disorder:  Suicidal ideations- Followed by Psychiatry. No need for IVC OR 1:1 per psychiatry MD. Continue home dose Buspar . Patient wishes to go drug rehab program.   GERD: Continues PPIs   COPD: No acute exacerbation.  Homeless- TOC to provide resources. Dc plan to drug rehab.     Out of bed to chair. Incentive  spirometry. Nursing supportive care. Fall, aspiration precautions. Diet:  Diet Orders (From admission, onward)     Start     Ordered   05/09/24 1005  Diet regular Room service appropriate? Yes; Fluid consistency: Thin  Diet effective now       Question Answer Comment  Room service appropriate? Yes   Fluid consistency: Thin      05/09/24 1004           DVT prophylaxis: heparin  injection 5,000 Units Start: 05/06/24 0030  Level of care: Telemetry Medical   Code Status: Full Code  Subjective: Patient is seen and examined today morning. States she will have rehab bed tomorrow. Denies any complaints. Advised to walk in hallways.  Physical Exam: Vitals:   05/12/24 0600 05/12/24 0738 05/12/24 1000 05/12/24 1342  BP:  (!) 147/85  (!) 152/85  Pulse:  62  70  Resp:      Temp:  97.7 F (36.5 C)  98.9 F (37.2 C)  TempSrc:  Oral  Oral  SpO2:  98% 98% 99%  Weight: 99.1 kg       General - Middle aged [place] American female, no distress. HEENT - PERRLA, EOMI, atraumatic head, non tender sinuses. Lung - Clear, no rales, rhonchi, wheezes. Heart - S1, S2 heard, no murmurs, rubs, trace pedal edema. Abdomen - Soft, non tender, no guarding, bowel sounds good Neuro - Alert, awake and oriented x 3, non focal exam. Skin - Warm and dry.  Data Reviewed:      Latest Ref Rng & Units 05/06/2024    2:51 AM 05/05/2024    7:52 PM 04/25/2021   12:45 PM  CBC  WBC 4.0 - 10.5  K/uL 7.2  7.9  6.0   Hemoglobin 12.0 - 15.0 g/dL 86.0  84.0  87.5   Hematocrit 36.0 - 46.0 % 42.1  47.1  37.7   Platelets 150 - 400 K/uL 377  494  325       Latest Ref Rng & Units 05/12/2024    5:30 AM 05/11/2024    6:04 AM 05/10/2024    3:35 AM  BMP  Glucose 70 - 99 mg/dL 85  88  98   BUN 6 - 20 mg/dL 16  12  14    Creatinine 0.44 - 1.00 mg/dL 8.58  8.62  8.60   Sodium 135 - 145 mmol/L 141  141  139   Potassium 3.5 - 5.1 mmol/L 4.0  4.0  3.8   Chloride 98 - 111 mmol/L 110  109  108   CO2 22 - 32 mmol/L 23  22  20     Calcium  8.9 - 10.3 mg/dL 8.5  8.7  8.6    No results found.   Family Communication: Discussed with patient, understand and agree. All questions answered.  Disposition: Status is: Inpatient Remains inpatient appropriate because: homeless, drug rehab  Planned Discharge Destination: Rehab     Time spent: 38 minutes  Author: Concepcion Riser, MD 05/12/2024 4:52 PM Secure chat 7am to 7pm For on call review [email or web address].

## 2024-05-12 NOTE — Plan of Care (Signed)
  Problem: Education: Goal: Knowledge of General Education information will improve Description: Including pain rating scale, medication(s)/side effects and non-pharmacologic comfort measures Outcome: Progressing   Problem: Health Behavior/Discharge Planning: Goal: Ability to manage health-related needs will improve Outcome: Progressing   Problem: Clinical Measurements: Goal: Ability to maintain clinical measurements within normal limits will improve Outcome: Progressing Goal: Will remain free from infection Outcome: Progressing Goal: Diagnostic test results will improve Outcome: Progressing Goal: Respiratory complications will improve Outcome: Progressing Goal: Cardiovascular complication will be avoided Outcome: Progressing   Problem: Coping: Goal: Level of anxiety will decrease Outcome: Progressing   Problem: Nutrition: Goal: Adequate nutrition will be maintained Outcome: Progressing   Problem: Activity: Goal: Risk for activity intolerance will decrease Outcome: Progressing   Problem: Pain Managment: Goal: General experience of comfort will improve and/or be controlled Outcome: Progressing   Problem: Elimination: Goal: Will not experience complications related to bowel motility Outcome: Progressing Goal: Will not experience complications related to urinary retention Outcome: Progressing   Problem: Skin Integrity: Goal: Risk for impaired skin integrity will decrease Outcome: Progressing   Problem: Safety: Goal: Ability to remain free from injury will improve Outcome: Progressing

## 2024-05-13 DIAGNOSIS — F17200 Nicotine dependence, unspecified, uncomplicated: Secondary | ICD-10-CM | POA: Diagnosis not present

## 2024-05-13 DIAGNOSIS — F1910 Other psychoactive substance abuse, uncomplicated: Secondary | ICD-10-CM | POA: Diagnosis not present

## 2024-05-13 DIAGNOSIS — N179 Acute kidney failure, unspecified: Secondary | ICD-10-CM | POA: Diagnosis not present

## 2024-05-13 DIAGNOSIS — F331 Major depressive disorder, recurrent, moderate: Secondary | ICD-10-CM | POA: Diagnosis not present

## 2024-05-13 MED ORDER — TRAZODONE HCL 50 MG PO TABS
50.0000 mg | ORAL_TABLET | Freq: Every evening | ORAL | Status: DC | PRN
  Administered 2024-05-14: 50 mg via ORAL
  Filled 2024-05-13: qty 1

## 2024-05-13 NOTE — Progress Notes (Signed)
 Progress Note   Patient: Carrie Adkins FMW:[id number] DOB: 1966-04-14 DOA: 05/05/2024     8 DOS: the patient was seen and examined on 05/13/2024   Brief hospital course: Carrie Adkins is a 58 y.o. female with medical history significant of polysubstance abuse, cocaine abuse and tobacco abuse, remote nonobstructive coronary artery disease, COPD, depression with previous suicidal ideation, essential hypertension, hyperlipidemia who presented to the ER from an abandoned house complaint of chest pain shortness of breath.   Workup in the ER showed significant abnormalities including a creatinine of 5.6, hypokalemia and hyponatremia.  Patient appears dehydrated, not eaten for several days. Admitted to TRH service for further management and evaluation.   05/06/24 She did have suicidal ideations, one to one sitter and psychiatry consulted. 9/23//25  - psych evaluated, not suicidal, can be off 1:1. 05/08/24 - kidney function better. Psychiatry follow up advised inpatient psych placement. 05/13/24 - awaiting rehab placement (bed wont be available till wedneday per TOC)  Assessment and Plan: Acute on chronic kidney disease stage 3A- Prerenal in the setting of poor oral intake, food insecurity.  Possible chronic component, on top takes ACEI. Today Creatinine improved to 1.37 from her presentation of 5.6. The current kidney function is likely her baseline. (Last normal kidney function form 2022). Outpatient follow up with PCP, nephrology suggested. Will continue to hold ACEI.   Hypokalemia:  Improved with repletion.   Polysubstance abuse:  Discussed ill effects of illicit drugs. Resources will be provided by social work. She wishes to go rehab. TOC helping her.   Major depressive disorder:  Suicidal ideations- Followed by Psychiatry. No need for IVC OR 1:1 per psychiatry MD. Continue home dose Buspar . Patient wishes to go drug rehab program.   GERD: Continues PPIs   COPD: No  acute exacerbation.  Homeless- TOC to provide resources. Dc plan to drug rehab.     Out of bed to chair. Incentive spirometry. Nursing supportive care. Fall, aspiration precautions. Diet:  Diet Orders (From admission, onward)     Start     Ordered   05/09/24 1005  Diet regular Room service appropriate? Yes; Fluid consistency: Thin  Diet effective now       Question Answer Comment  Room service appropriate? Yes   Fluid consistency: Thin      05/09/24 1004           DVT prophylaxis: heparin  injection 5,000 Units Start: 05/06/24 0030  Level of care: Telemetry Medical   Code Status: Full Code  Subjective: Patient is seen and examined today morning. Awaiting rehab bed. No complaints. Wants her home meds resumed.  Physical Exam: Vitals:   05/13/24 0607 05/13/24 0608 05/13/24 0758 05/13/24 1443  BP: (!) 157/95  (!) 157/91 (!) 155/93  Pulse: 73  61 65  Resp: 18  17 17   Temp: 98.1 F (36.7 C)  98.8 F (37.1 C) 98.2 F (36.8 C)  TempSrc: Oral  Oral Oral  SpO2: 100%  100% 100%  Weight:  97.3 kg      General - Middle aged [place] American female, no distress. HEENT - PERRLA, EOMI, atraumatic head, non tender sinuses. Lung - Clear, no rales, rhonchi, wheezes. Heart - S1, S2 heard, no murmurs, rubs, trace pedal edema. Abdomen - Soft, non tender, no guarding, bowel sounds good Neuro - Alert, awake and oriented x 3, non focal exam. Skin - Warm and dry.  Data Reviewed:      Latest Ref Rng & Units 05/06/2024    2:51  AM 05/05/2024    7:52 PM 04/25/2021   12:45 PM  CBC  WBC 4.0 - 10.5 K/uL 7.2  7.9  6.0   Hemoglobin 12.0 - 15.0 g/dL 86.0  84.0  87.5   Hematocrit 36.0 - 46.0 % 42.1  47.1  37.7   Platelets 150 - 400 K/uL 377  494  325       Latest Ref Rng & Units 05/12/2024    5:30 AM 05/11/2024    6:04 AM 05/10/2024    3:35 AM  BMP  Glucose 70 - 99 mg/dL 85  88  98   BUN 6 - 20 mg/dL 16  12  14    Creatinine 0.44 - 1.00 mg/dL 8.58  8.62  8.60   Sodium 135 - 145  mmol/L 141  141  139   Potassium 3.5 - 5.1 mmol/L 4.0  4.0  3.8   Chloride 98 - 111 mmol/L 110  109  108   CO2 22 - 32 mmol/L 23  22  20    Calcium  8.9 - 10.3 mg/dL 8.5  8.7  8.6    No results found.   Family Communication: Discussed with patient, understand and agree. All questions answered.  Disposition: Status is: Inpatient Remains inpatient appropriate because: homeless, drug rehab  Planned Discharge Destination: Rehab     Time spent: 39 minutes  Author: Concepcion Riser, MD 05/13/2024 4:33 PM Secure chat 7am to 7pm For on call review [email or web address].

## 2024-05-13 NOTE — Plan of Care (Signed)
   Problem: Activity: Goal: Risk for activity intolerance will decrease Outcome: Progressing   Problem: Safety: Goal: Ability to remain free from injury will improve Outcome: Progressing   Problem: Skin Integrity: Goal: Risk for impaired skin integrity will decrease Outcome: Progressing

## 2024-05-13 NOTE — TOC Progression Note (Signed)
 Transition of Care [facility]) - Progression Note    Patient Details  Name: Carrie Adkins MRN: [id number] Date of Birth: 1966-07-01  [facility]) CM/SW Contact  Bridget Cordella Simmonds, LCSW Phone Number: 05/13/2024, 10:27 AM  Clinical Narrative:   TC Sherry/[facility]: Pt is approved for admission, but no bed available until Wednesday AM, would like her there by 1030am.                      Expected Discharge Plan and Services                                               Social Drivers of Health (SDOH) Interventions SDOH Screenings   Food Insecurity: Food Insecurity Present (05/06/2024)  Housing: High Risk (05/06/2024)  Transportation Needs: Unmet Transportation Needs (05/06/2024)  Utilities: Not At Risk (05/06/2024)  Alcohol Screen: Low Risk  (04/29/2021)  Tobacco Use: High Risk (05/05/2024)    Readmission Risk Interventions     No data to display

## 2024-05-14 DIAGNOSIS — F1420 Cocaine dependence, uncomplicated: Secondary | ICD-10-CM

## 2024-05-14 DIAGNOSIS — N179 Acute kidney failure, unspecified: Secondary | ICD-10-CM | POA: Diagnosis not present

## 2024-05-14 MED ORDER — METOPROLOL TARTRATE 25 MG PO TABS
25.0000 mg | ORAL_TABLET | Freq: Two times a day (BID) | ORAL | Status: DC
  Administered 2024-05-14 – 2024-05-15 (×3): 25 mg via ORAL
  Filled 2024-05-14 (×3): qty 1

## 2024-05-14 MED ORDER — BUSPIRONE HCL 5 MG PO TABS: 5.0000 mg | ORAL_TABLET | Freq: Two times a day (BID) | ORAL | 0 refills | Status: DC

## 2024-05-14 MED ORDER — NEPRO/CARBSTEADY PO LIQD: 237.0000 mL | Freq: Three times a day (TID) | ORAL | 0 refills | Status: AC | PRN

## 2024-05-14 MED ORDER — ALBUTEROL SULFATE HFA 108 (90 BASE) MCG/ACT IN AERS: 2.0000 | INHALATION_SPRAY | Freq: Four times a day (QID) | RESPIRATORY_TRACT | 0 refills | Status: DC | PRN

## 2024-05-14 MED ORDER — CALCIUM CARBONATE ANTACID 1250 MG/5ML PO SUSP: 500.0000 mg | Freq: Three times a day (TID) | ORAL | 0 refills | Status: DC

## 2024-05-14 MED ORDER — METOPROLOL TARTRATE 25 MG PO TABS: 25.0000 mg | ORAL_TABLET | Freq: Two times a day (BID) | ORAL | 1 refills | Status: DC

## 2024-05-14 NOTE — Discharge Summary (Signed)
 Physician Discharge Summary   Patient: Carrie Adkins MRN: [id number] DOB: June 14, 1966  Admit date:     05/05/2024  Discharge date: 05/15/2024  Discharge Physician: Carrie Adkins   PCP: System, Provider Not In   Recommendations at discharge:    PCP follow up in 1 week. Psychiatry follow up advised. Nephrology follow up as scheduled.  Discharge Diagnoses: Principal Problem:   AKI (acute kidney injury) Active Problems:   Polysubstance abuse (HCC)   Coronary artery disease involving native coronary artery of native heart   TOBACCO ABUSE   COPD without exacerbation (HCC)   GERD without esophagitis   Major depressive disorder, recurrent episode, moderate (HCC)   Anxiety disorder, unspecified   Moderate cocaine use disorder (HCC)  Resolved Problems:   * No resolved hospital problems. *   05/15/24 AM Addendum -- pt seen and examined at bedside this AM.  She reports feeling well and has no acute complaints.  Pt remains medically stable for d/c to rehab program this morning.    Hospital Course: Carrie Adkins is a 58 y.o. female with medical history significant of polysubstance abuse, cocaine abuse and tobacco abuse, remote nonobstructive coronary artery disease, COPD, depression with previous suicidal ideation, essential hypertension, hyperlipidemia who presented to the ER from an abandoned house complaint of chest pain shortness of breath.   Workup in the ER showed significant abnormalities including a creatinine of 5.6, hypokalemia and hyponatremia.  Patient appears dehydrated, not eaten for several days. Admitted to TRH service for further management and evaluation.    During hospital stay patient is started on IV hydration, electrolyte repletion.  She did have suicidal ideations, one to one sitter and psychiatry consulted, who evaluated her advised depression in the setting of psychosocial stressors, no need for IVC or one-to-one sitter low risk for self-harm.  Her  kidney function improved, electrolytes within normal limits.  Patient likely has CKD stage III A advised PCP, nephrology follow-up upon discharge.  TOC consulted for substance abuse, homelessness resources.  Patient opted to go for drug rehab program.   Assessment and Plan: Acute on chronic kidney disease stage 3A- Prerenal in the setting of poor oral intake, food insecurity.  Possible chronic component, on top takes ACEI. Today Creatinine improved to 1.37 from her presentation of 5.6. The current kidney function is likely her baseline. (Last normal kidney function form 2022). Hold ACEI/ hydrochlorothiazide . Outpatient follow up with PCP, nephrology suggested.   Hypokalemia:  Improved with repletion.  Hypertension- Hold ACEI/ hydrochlorothiazide , started metoprolol 25bid. Advised PCP follow up for titration of antihypertensives.   Polysubstance abuse:  Discussed ill effects of illicit drugs. Resources provided by social work. She wishes to go drug rehab.  Bed available for tomorrow.   Major depressive disorder:  Suicidal ideations- Followed by Psychiatry. No need for IVC OR 1:1 per psychiatry MD. Continue home dose Buspar . Patient wishes to go drug rehab program.   GERD: Continues PPIs   COPD: No acute exacerbation.   Homeless- TOC to provide resources. Dc plan to drug rehab.        Consultants: Psychiatry Procedures performed: None Disposition: Rehabilitation facility Diet recommendation:  Cardiac diet DISCHARGE MEDICATION: Allergies as of 05/15/2024       Reactions   Cymbalta  [duloxetine  Hcl] Other (See Comments)   Hallucinations; psychosis        Medication List     STOP taking these medications    lisinopril -hydrochlorothiazide  20-12.5 MG tablet Commonly known as: Zestoretic   TAKE these medications    acetaminophen  500 MG tablet Commonly known as: TYLENOL  Take 500 mg by mouth every 6 (six) hours as needed for mild pain (pain score 1-3).    albuterol  108 (90 Base) MCG/ACT inhaler Commonly known as: VENTOLIN  HFA Inhale 2 puffs into the lungs every 6 (six) hours as needed for wheezing or shortness of breath.   busPIRone  5 MG tablet Commonly known as: BUSPAR  Take 1 tablet (5 mg total) by mouth 2 (two) times daily.   calcium  carbonate (dosed in mg elemental calcium ) 1250 MG/5ML Susp Take 5 mLs (500 mg of elemental calcium  total) by mouth 3 (three) times daily with meals.   feeding supplement (NEPRO CARB STEADY) Liqd Take 237 mLs by mouth 3 (three) times daily as needed (Supplement).   metoprolol tartrate 25 MG tablet Commonly known as: LOPRESSOR Take 1 tablet (25 mg total) by mouth 2 (two) times daily.   traZODone  50 MG tablet Commonly known as: DESYREL  Take 1 tablet (50 mg total) by mouth at bedtime as needed for sleep.        Follow-up Information     Carrie Bernardino NOVAK, MD Follow up in 2 week(s).   Specialty: Nephrology Contact information: [address] [place] [address] [phone]                Discharge Exam: Carrie Adkins   05/12/24 0600 05/13/24 0608 05/15/24 0427  Weight: 99.1 kg 97.3 kg 100.4 kg   General - Middle aged [place] American female, no distress. HEENT - PERRLA, EOMI, atraumatic head, non tender sinuses. Lung - Clear, no rales, rhonchi, wheezes. Heart - S1, S2 heard, no murmurs, rubs, trace pedal edema. Abdomen - Soft, non tender, no guarding, bowel sounds good Neuro - Alert, awake and oriented x 3, non focal exam. Skin - Warm and dry.  Condition at discharge: stable  The results of significant diagnostics from this hospitalization (including imaging, microbiology, ancillary and laboratory) are listed below for reference.   Imaging Studies: US  RENAL Result Date: 05/06/2024 EXAM: US  Retroperitoneum Complete, Renal. CLINICAL HISTORY: AKI (acute kidney injury) (HCC) TECHNIQUE: Real-time ultrasound of the retroperitoneum (complete) with image documentation. COMPARISON:  05/05/2024 CT abdomen/pelvis. FINDINGS: RIGHT KIDNEY: The right kidney measures 8.8 x 4.6 x 6.0 cm with a calculated volume of 128 ml. Echogenic renal parenchyma suggestive of medical renal disease. No hydronephrosis, renal stone, or mass visualized. LEFT KIDNEY: The left kidney measures 9.7 x 4.4 x 5.1 cm with a calculated volume of 114 ml. Echogenic renal parenchyma suggestive of medical renal disease. No hydronephrosis, renal stone, or mass visualized. BLADDER: Unremarkable as visualized. IMPRESSION: 1. No hydronephrosis. 2. Suspected medical renal disease. Electronically signed by: Pinkie Pebbles MD 05/06/2024 02:47 AM EDT RP Workstation: [id number]   CT Renal Stone Study Result Date: 05/05/2024 EXAM: CT ABDOMEN AND PELVIS WITHOUT CONTRAST 05/05/2024 10:26:41 PM TECHNIQUE: CT of the abdomen and pelvis was performed without the administration of intravenous contrast. Multiplanar reformatted images are provided for review. Automated exposure control, iterative reconstruction, and/or weight-based adjustment of the mA/kV was utilized to reduce the radiation dose to as low as reasonably achievable. COMPARISON: None available. CLINICAL HISTORY: Renal ischemia or infarction. Pt here from living in a abandoned house with c/o chest pain and sob, no n/v, pt did do some crack 2 days ago. FINDINGS: LOWER CHEST: No acute abnormality. LIVER: The liver is unremarkable. GALLBLADDER AND BILE DUCTS: Layering gallbladder sludge, without associated inflammatory changes. No biliary ductal dilatation. SPLEEN: No acute abnormality. PANCREAS:  No acute abnormality. ADRENAL GLANDS: No acute abnormality. KIDNEYS, URETERS AND BLADDER: No stones in the kidneys or ureters. No hydronephrosis. No perinephric or periureteral stranding. Urinary bladder is unremarkable. GI AND BOWEL: Normal appendix (image 71). Stomach demonstrates no acute abnormality. There is no bowel obstruction. PERITONEUM AND RETROPERITONEUM: No ascites. No free air.  VASCULATURE: Atherosclerotic calcifications of the abdominal aorta and branch vessels. LYMPH NODES: No lymphadenopathy. REPRODUCTIVE ORGANS: Calcified uterine fibroids. BONES AND SOFT TISSUES: Bilateral hip arthroplasties. No acute osseous abnormality. No focal soft tissue abnormality. IMPRESSION: 1. No acute abnormalities. 2. Ancillary findings, as above. Electronically signed by: Pinkie Pebbles MD 05/05/2024 10:31 PM EDT RP Workstation: [id number]   DG Chest Port 1 View Result Date: 05/05/2024 CLINICAL DATA:  Chest pain EXAM: PORTABLE CHEST 1 VIEW COMPARISON:  Chest x-ray 04/22/2021 FINDINGS: The heart size and mediastinal contours are within normal limits. Both lungs are clear. The visualized skeletal structures are unremarkable. IMPRESSION: No active disease. Electronically Signed   By: Greig Pique M.D.   On: 05/05/2024 20:16    Microbiology: Results for orders placed or performed during the hospital encounter of 04/21/21  Wet prep, genital     Status: Abnormal   Collection Time: 04/22/21 12:58 AM   Specimen: Urine, Clean Catch  Result Value Ref Range Status   Yeast Wet Prep HPF POC NONE SEEN NONE SEEN Final   Trich, Wet Prep NONE SEEN NONE SEEN Final   Clue Cells Wet Prep HPF POC PRESENT (A) NONE SEEN Final   WBC, Wet Prep HPF POC NONE SEEN NONE SEEN Final   Sperm NONE SEEN  Final    Comment: Performed at [facility], [address], [place], [place] [address]  Resp Panel by RT-PCR (Flu A&B, Covid) Nasopharyngeal Swab     Status: None   Collection Time: 04/22/21  5:28 AM   Specimen: Nasopharyngeal Swab; Nasopharyngeal(NP) swabs in vial transport medium  Result Value Ref Range Status   SARS Coronavirus 2 by RT PCR NEGATIVE NEGATIVE Final    Comment: (NOTE) SARS-CoV-2 target nucleic acids are NOT DETECTED.  The SARS-CoV-2 RNA is generally detectable in upper respiratory specimens during the acute phase of infection. The lowest concentration of SARS-CoV-2 viral  copies this assay can detect is 138 copies/mL. A negative result does not preclude SARS-Cov-2 infection and should not be used as the sole basis for treatment or other patient management decisions. A negative result may occur with  improper specimen collection/handling, submission of specimen other than nasopharyngeal swab, presence of viral mutation(s) within the areas targeted by this assay, and inadequate number of viral copies(<138 copies/mL). A negative result must be combined with clinical observations, patient history, and epidemiological information. The expected result is Negative.  Fact Sheet for Patients:  BloggerCourse.com  Fact Sheet for Healthcare Providers:  SeriousBroker.it  This test is no t yet approved or cleared by the [place]  FDA and  has been authorized for detection and/or diagnosis of SARS-CoV-2 by FDA under an Emergency Use Authorization (EUA). This EUA will remain  in effect (meaning this test can be used) for the duration of the COVID-19 declaration under Section 564(b)(1) of the Act, 21 U.S.C.section 360bbb-3(b)(1), unless the authorization is terminated  or revoked sooner.       Influenza A by PCR NEGATIVE NEGATIVE Final   Influenza B by PCR NEGATIVE NEGATIVE Final    Comment: (NOTE) The Xpert Xpress SARS-CoV-2/FLU/RSV plus assay is intended as an aid in the diagnosis of influenza from Nasopharyngeal  swab specimens and should not be used as a sole basis for treatment. Nasal washings and aspirates are unacceptable for Xpert Xpress SARS-CoV-2/FLU/RSV testing.  Fact Sheet for Patients: BloggerCourse.com  Fact Sheet for Healthcare Providers: SeriousBroker.it  This test is not yet approved or cleared by the [place]  FDA and has been authorized for detection and/or diagnosis of SARS-CoV-2 by FDA under an Emergency Use Authorization (EUA). This  EUA will remain in effect (meaning this test can be used) for the duration of the COVID-19 declaration under Section 564(b)(1) of the Act, 21 U.S.C. section 360bbb-3(b)(1), unless the authorization is terminated or revoked.  Performed at [facility], [address], [place], [place] [address]   Resp Panel by RT-PCR (Flu A&B, Covid) Nasopharyngeal Swab     Status: None   Collection Time: 04/28/21  4:45 PM   Specimen: Nasopharyngeal Swab; Nasopharyngeal(NP) swabs in vial transport medium  Result Value Ref Range Status   SARS Coronavirus 2 by RT PCR NEGATIVE NEGATIVE Final    Comment: (NOTE) SARS-CoV-2 target nucleic acids are NOT DETECTED.  The SARS-CoV-2 RNA is generally detectable in upper respiratory specimens during the acute phase of infection. The lowest concentration of SARS-CoV-2 viral copies this assay can detect is 138 copies/mL. A negative result does not preclude SARS-Cov-2 infection and should not be used as the sole basis for treatment or other patient management decisions. A negative result may occur with  improper specimen collection/handling, submission of specimen other than nasopharyngeal swab, presence of viral mutation(s) within the areas targeted by this assay, and inadequate number of viral copies(<138 copies/mL). A negative result must be combined with clinical observations, patient history, and epidemiological information. The expected result is Negative.  Fact Sheet for Patients:  BloggerCourse.com  Fact Sheet for Healthcare Providers:  SeriousBroker.it  This test is no t yet approved or cleared by the [place]  FDA and  has been authorized for detection and/or diagnosis of SARS-CoV-2 by FDA under an Emergency Use Authorization (EUA). This EUA will remain  in effect (meaning this test can be used) for the duration of the COVID-19 declaration under Section 564(b)(1) of the Act,  21 U.S.C.section 360bbb-3(b)(1), unless the authorization is terminated  or revoked sooner.       Influenza A by PCR NEGATIVE NEGATIVE Final   Influenza B by PCR NEGATIVE NEGATIVE Final    Comment: (NOTE) The Xpert Xpress SARS-CoV-2/FLU/RSV plus assay is intended as an aid in the diagnosis of influenza from Nasopharyngeal swab specimens and should not be used as a sole basis for treatment. Nasal washings and aspirates are unacceptable for Xpert Xpress SARS-CoV-2/FLU/RSV testing.  Fact Sheet for Patients: BloggerCourse.com  Fact Sheet for Healthcare Providers: SeriousBroker.it  This test is not yet approved or cleared by the [place]  FDA and has been authorized for detection and/or diagnosis of SARS-CoV-2 by FDA under an Emergency Use Authorization (EUA). This EUA will remain in effect (meaning this test can be used) for the duration of the COVID-19 declaration under Section 564(b)(1) of the Act, 21 U.S.C. section 360bbb-3(b)(1), unless the authorization is terminated or revoked.  Performed at [facility], [address], [place], [place] [address]     Labs: CBC: No results for input(s): WBC, NEUTROABS, HGB, HCT, MCV, PLT in the last 168 hours. Basic Metabolic Panel: Recent Labs  Lab 05/09/24 0552 05/10/24 0335 05/11/24 0604 05/12/24 0530  NA 137 139 141 141  K 3.6 3.8 4.0 4.0  CL 108 108 109 110  CO2 20* 20* 22 23  GLUCOSE 90 98 88 85  BUN 20 14 12 16   CREATININE 1.45* 1.39* 1.37* 1.41*  CALCIUM  8.5* 8.6* 8.7* 8.5*   Liver Function Tests: No results for input(s): AST, ALT, ALKPHOS, BILITOT, PROT, ALBUMIN in the last 168 hours. CBG: No results for input(s): GLUCAP in the last 168 hours.  Discharge time spent: 35 minutes.  Signed: Burnard DELENA Cunning, DO [facility] 05/15/2024

## 2024-05-14 NOTE — Plan of Care (Signed)

## 2024-05-14 NOTE — Progress Notes (Signed)
 Case management notified patient will go to rehab tomorrow. Dc summary done need update of dc date.

## 2024-05-14 NOTE — Plan of Care (Signed)

## 2024-05-15 NOTE — Plan of Care (Signed)

## 2024-05-15 NOTE — TOC Transition Note (Signed)
 Transition of Care [facility]) - Discharge Note   Patient Details  Name: Carrie Adkins MRN: [id number] Date of Birth: September 06, 1965  [facility] CM/SW Contact:  Bridget Cordella Simmonds, LCSW Phone Number: 05/15/2024, 9:15 AM   Clinical Narrative:   Pt discharging to [facility] by [facility] taxi. No RN report needed.     Final next level of care: Other (comment) ([facility] residential drug treatment) Barriers to Discharge: Barriers Resolved   Patient Goals and CMS Choice            Discharge Placement                Patient to be transferred to facility by: taxi      Discharge Plan and Services Additional resources added to the After Visit Summary for                                       Social Drivers of Health (SDOH) Interventions SDOH Screenings   Food Insecurity: Food Insecurity Present (05/06/2024)  Housing: High Risk (05/06/2024)  Transportation Needs: Unmet Transportation Needs (05/06/2024)  Utilities: Not At Risk (05/06/2024)  Alcohol Screen: Low Risk  (04/29/2021)  Tobacco Use: High Risk (05/05/2024)     Readmission Risk Interventions     No data to display

## 2024-05-15 NOTE — Progress Notes (Signed)
 Pt has Dc order. Pt will be DC to drug rehab, AVS was given and explained to pt. Pt left the floor via wheelchair, cab was arranged by case manager. Per CM, RN do not need to call for report.

## 2024-05-15 NOTE — TOC Progression Note (Addendum)
 Transition of Care [facility]) - Progression Note    Patient Details  Name: Carrie Adkins MRN: [id number] Date of Birth: 07-11-1966  [facility]) CM/SW Contact  Bridget Cordella Simmonds, LCSW Phone Number: 05/15/2024, 9:13 AM  Clinical Narrative:   TC Message from Sherri/[facility]: confirmed admission for 1030am tomorrow.  CSW spoke with pt regarding transportation.  She does not have anyone who can bring her to [facility].  Will use taxi.  Transportation waiver signed and placed on chart.                     Expected Discharge Plan and Services         Expected Discharge Date: 05/15/24                                     Social Drivers of Health (SDOH) Interventions SDOH Screenings   Food Insecurity: Food Insecurity Present (05/06/2024)  Housing: High Risk (05/06/2024)  Transportation Needs: Unmet Transportation Needs (05/06/2024)  Utilities: Not At Risk (05/06/2024)  Alcohol Screen: Low Risk  (04/29/2021)  Tobacco Use: High Risk (05/05/2024)    Readmission Risk Interventions     No data to display

## 2024-05-28 ENCOUNTER — Ambulatory Visit: Payer: MEDICAID | Admitting: Family Medicine

## 2024-05-30 ENCOUNTER — Ambulatory Visit: Payer: Self-pay | Admitting: Family Medicine

## 2024-07-10 ENCOUNTER — Ambulatory Visit: Payer: Self-pay | Admitting: Family Medicine

## 2024-08-05 ENCOUNTER — Encounter: Payer: Self-pay | Admitting: Family Medicine

## 2024-08-05 ENCOUNTER — Ambulatory Visit (INDEPENDENT_AMBULATORY_CARE_PROVIDER_SITE_OTHER): Payer: MEDICAID | Admitting: Family Medicine

## 2024-08-05 VITALS — BP 156/94 | HR 80 | Ht 66.0 in | Wt 205.6 lb

## 2024-08-05 DIAGNOSIS — G8929 Other chronic pain: Secondary | ICD-10-CM

## 2024-08-05 DIAGNOSIS — I10 Essential (primary) hypertension: Secondary | ICD-10-CM

## 2024-08-05 DIAGNOSIS — J449 Chronic obstructive pulmonary disease, unspecified: Secondary | ICD-10-CM | POA: Diagnosis not present

## 2024-08-05 DIAGNOSIS — F419 Anxiety disorder, unspecified: Secondary | ICD-10-CM | POA: Diagnosis not present

## 2024-08-05 DIAGNOSIS — F32A Depression, unspecified: Secondary | ICD-10-CM | POA: Diagnosis not present

## 2024-08-05 MED ORDER — AMLODIPINE BESYLATE 10 MG PO TABS: 10.0000 mg | ORAL_TABLET | Freq: Every day | ORAL | 1 refills | Status: AC

## 2024-08-05 MED ORDER — ESCITALOPRAM OXALATE 5 MG PO TABS: 10.0000 mg | ORAL_TABLET | Freq: Every day | ORAL | 1 refills | Status: DC

## 2024-08-05 MED ORDER — TRAZODONE HCL 50 MG PO TABS: 50.0000 mg | ORAL_TABLET | Freq: Every evening | ORAL | 0 refills | Status: DC | PRN

## 2024-08-05 MED ORDER — METOPROLOL TARTRATE 25 MG PO TABS: 25.0000 mg | ORAL_TABLET | Freq: Two times a day (BID) | ORAL | 1 refills | Status: AC

## 2024-08-05 MED ORDER — ALBUTEROL SULFATE HFA 108 (90 BASE) MCG/ACT IN AERS: 2.0000 | INHALATION_SPRAY | Freq: Four times a day (QID) | RESPIRATORY_TRACT | 0 refills | Status: AC | PRN

## 2024-08-05 NOTE — Patient Instructions (Signed)
 VISIT SUMMARY:  Today, we addressed your high blood pressure, anxiety, and joint pain. We made some adjustments to your medications to better manage these conditions and discussed your concerns about your overall health.  YOUR PLAN:  -PRIMARY HYPERTENSION: Primary hypertension means high blood pressure without a known secondary cause. Your blood pressure remains high, so we increased your amlodipine  dose to 10 mg daily and continued your metoprolol . This should help better control your blood pressure.  -ANXIETY AND DEPRESSION: Anxiety and depression are mental health conditions that can affect your mood and overall well-being. Since buspirone  was not effective, we started you on Lexapro , beginning with 5 mg daily for one week, then increasing to 10 mg daily. We also refilled your trazodone  to help with sleep.  -CHRONIC OBSTRUCTIVE PULMONARY DISEASE (COPD): COPD is a chronic lung condition that makes it hard to breathe. We refilled your inhaler prescription to help manage your symptoms.  INSTRUCTIONS:  Please follow up in 4-6 weeks to assess how well the new medications are working. If you experience any side effects or if your symptoms do not improve, contact our office. Continue taking your medications as prescribed and monitor your blood pressure regularly.

## 2024-08-05 NOTE — Progress Notes (Addendum)
 "  Name: Carrie Adkins   Date of Visit: 08/05/2024   Date of last visit with me: Visit date not found   CHIEF COMPLAINT:  Chief Complaint  Patient presents with   Establish Care    New patient. Needs to get back in routine with medication. Has had some kidney problems went to [facility] and they put her back on amlodipine  last week. Has been out of all medication sense October 22nd, has been diagnose with a brain tumor and has extreme amount of pain. Was on Buspar  for anxiety and it did not work for her. Feels always anxious or on edge. Feels like health is getting worse. Wants a referral to a neurologist.        HPI:  Discussed the use of AI scribe software for clinical note transcription with the patient, who gave verbal consent to proceed.  History of Present Illness   Carrie Adkins is a 58 year old female with hypertension who presents with high blood pressure and joint pain.  Her blood pressure remains elevated despite being on amlodipine  and metoprolol . The dose of amlodipine  is being increased to 10 mg. She is concerned about her blood pressure management and its impact on her overall health.  She experiences significant anxiety and has been on buspirone , which was not effective. She also takes trazodone  to aid with sleep.  She experiences pain in every joint, describing it as 'I hurt all day, every day.' She associates this pain with her brain tumor diagnosis. Her kidney doctor advised against taking ibuprofen, Aleve, or Advil, and she finds Tylenol  ineffective. She is currently on Mobic for pain management.  She mentions difficulty with long-distance walking and inquires about obtaining a handicap sticker for her daughter's car.         OBJECTIVE:       08/05/2024    8:56 AM  Depression screen PHQ 2/9  Decreased Interest 2  Down, Depressed, Hopeless 3  PHQ - 2 Score 5  Altered sleeping 2  Tired, decreased energy 3  Change in appetite 2   Feeling bad or failure about yourself  0  Trouble concentrating 2  Moving slowly or fidgety/restless 3  Suicidal thoughts 1  PHQ-9 Score 18    Flowsheet Row Office Visit from 08/05/2024 in [facility]  PHQ-9 Total Score 18     Results reviewed with the patient and incorporated into the plan of care. Appropriate follow-up, medication adjustments, and/or therapy options discussed.  BP Readings from Last 3 Encounters:  08/05/24 (!) 156/94  05/15/24 (!) 140/77  08/31/23 (!) 201/104    BP (!) 156/94   Pulse 80   Ht 5' 6 (1.676 m)   Wt 205 lb 9.6 oz (93.3 kg)   SpO2 98%   BMI 33.18 kg/m    Physical Exam          Physical Exam Constitutional:      Appearance: Normal appearance.  Neurological:     General: No focal deficit present.     Mental Status: She is alert and oriented to person, place, and time. Mental status is at baseline.     ASSESSMENT/PLAN:   Assessment & Plan COPD without exacerbation (HCC)  Primary hypertension  Anxiety and depression  Other chronic pain    Assessment and Plan    Primary hypertension Blood pressure significantly elevated, contributing to health decline and anxiety. Managed with amlodipine  (increased to 10 mg daily) and continued metoprolol . - Increased amlodipine  to  10 mg daily. - Refilled metoprolol  prescription.  Anxiety and depression Anxiety exacerbated by uncontrolled hypertension. Buspirone  ineffective. Lexapro  initiated for management. - Initiated Lexapro  5 mg daily for one week, then increase to 10 mg daily. - Refilled trazodone  for sleep. - Scheduled follow-up in 4-6 weeks to assess medication efficacy.  Chronic obstructive pulmonary disease Requires ongoing management with inhalers. - Refilled inhaler prescription.      Chronic pain - Patient notes a hx of pain everywhere and given CKD cannot take an NSAIDS - Discussed options and at this time patient would prefer referral to pain management.     Oluwatoni Rotunno A. Vita MD [facility] "

## 2024-08-06 ENCOUNTER — Other Ambulatory Visit (HOSPITAL_COMMUNITY): Payer: Self-pay

## 2024-08-06 ENCOUNTER — Telehealth: Payer: Self-pay | Admitting: Pharmacy Technician

## 2024-08-06 MED ORDER — ESCITALOPRAM OXALATE 10 MG PO TABS: 10.0000 mg | ORAL_TABLET | Freq: Every day | ORAL | 1 refills | Status: AC

## 2024-08-06 NOTE — Telephone Encounter (Signed)
 Pharmacy Patient Advocate Encounter   Received notification from Onbase that prior authorization for Escitalopram  Oxalate 5MG  tablets is required/requested.   Insurance verification completed.   The patient is insured through [place] [employer].   Per test claim: Max daily dose of 1 tablet per day.  Can the order be changed to 1 10mg  tablet daily vs 2 5mg  tablets?   CMM Key# [id number]

## 2024-08-20 ENCOUNTER — Ambulatory Visit: Admitting: Family Medicine

## 2024-08-20 VITALS — BP 144/78 | HR 85 | Wt 215.0 lb

## 2024-08-20 DIAGNOSIS — I10 Essential (primary) hypertension: Secondary | ICD-10-CM

## 2024-08-20 DIAGNOSIS — F32A Depression, unspecified: Secondary | ICD-10-CM

## 2024-08-20 DIAGNOSIS — G8929 Other chronic pain: Secondary | ICD-10-CM | POA: Diagnosis not present

## 2024-08-20 DIAGNOSIS — F419 Anxiety disorder, unspecified: Secondary | ICD-10-CM | POA: Diagnosis not present

## 2024-08-20 MED ORDER — HYDROCHLOROTHIAZIDE 25 MG PO TABS: 25.0000 mg | ORAL_TABLET | Freq: Every day | ORAL | 3 refills | Status: AC

## 2024-08-20 NOTE — Progress Notes (Signed)
" ° °  Name: Carrie Adkins   Date of Visit: 08/20/2024   Date of last visit with me: 08/05/2024   CHIEF COMPLAINT:  Chief Complaint  Patient presents with   Follow-up    Follow up on blood pressure.        HPI:  Discussed the use of AI scribe software for clinical note transcription with the patient, who gave verbal consent to proceed.  History of Present Illness   Carrie Adkins is a 59 year old female who presents for follow-up regarding her blood pressure and mood management.  She is scheduled to see a pain doctor for the first time tomorrow, as her initial appointment was rescheduled due to lack of transportation.  She has recently started taking Lexapro  and notes an improvement in her mood, stating she does not feel 'doom and gloom' as much as before. However, she still experiences discomfort with noise and chatter.  Her blood pressure remains elevated, and she is currently being prescribed hydrochlorothiazide  at a dose of 25 mg. She has been managing her transportation issues and has obtained a handicapped parking permit.         OBJECTIVE:       08/05/2024    8:56 AM  Depression screen PHQ 2/9  Decreased Interest 2  Down, Depressed, Hopeless 3  PHQ - 2 Score 5  Altered sleeping 2  Tired, decreased energy 3  Change in appetite 2  Feeling bad or failure about yourself  0  Trouble concentrating 2  Moving slowly or fidgety/restless 3  Suicidal thoughts 1  PHQ-9 Score 18     BP Readings from Last 3 Encounters:  08/20/24 (!) 144/78  08/05/24 (!) 156/94  05/15/24 (!) 140/77    BP (!) 144/78   Pulse 85   Wt 215 lb (97.5 kg)   SpO2 98%   BMI 34.70 kg/m    Physical Exam          Physical Exam Constitutional:      Appearance: Normal appearance.  Neurological:     General: No focal deficit present.     Mental Status: She is alert and oriented to person, place, and time. Mental status is at baseline.     ASSESSMENT/PLAN:   Assessment &  Plan Primary hypertension  Other chronic pain  Anxiety and depression    Assessment and Plan    Primary hypertension Blood pressure remains elevated despite current management. - Initiated hydrochlorothiazide  25 mg daily.  Anxiety and depression Reports improvement in mood with Lexapro , but still experiences sensitivity to noise and chatter. - Continue current dose of Lexapro . - Reassess in 3-4 weeks for potential dose adjustment.         Katalyn Matin A. Vita MD [facility] "

## 2024-08-27 ENCOUNTER — Telehealth: Payer: Self-pay

## 2024-08-27 NOTE — Telephone Encounter (Signed)
 Copied from CRM [id number]. Topic: General - Other >> Aug 27, 2024 10:15 AM Wess RAMAN wrote: Reason for CRM: Patient would like to know the status of her letter for home health services.  Callback #: [phone]  Left voicemail for patient to let her know the status

## 2024-08-27 NOTE — Telephone Encounter (Signed)
 Copied from CRM #[id number]. Topic: General - Other >> Aug 27, 2024  2:01 PM Carrie Adkins wrote: Reason for CRM: Patient called in to return call to [facility] regarding status of [facility] services  Spoke with patient and let her know the status

## 2024-08-29 ENCOUNTER — Ambulatory Visit: Admitting: Family Medicine

## 2024-08-30 ENCOUNTER — Telehealth: Payer: Self-pay

## 2024-08-30 NOTE — Telephone Encounter (Signed)
 Copied from CRM [id number]. Topic: General - Other >> Aug 30, 2024 11:03 AM Alfonso HERO wrote: Reason for CRM: patient calling for status of Home Health letter asking for someone to call her back because the insurance hasn't rcvd it.   Callback #: [phone]

## 2024-09-06 ENCOUNTER — Telehealth: Payer: Self-pay | Admitting: Family Medicine

## 2024-09-06 NOTE — Telephone Encounter (Signed)
 Copied from CRM #[id number]. Topic: Medical Record Request - Other >> Sep 06, 2024  3:07 PM Berwyn MATSU wrote: Reason for CRM: Patient called in stating that she would like an update on paperwork for caregiver. Patient states if we can please fax it to # [phone]. Patients call back # [phone]  May you please advise.

## 2024-09-13 ENCOUNTER — Telehealth: Payer: Self-pay

## 2024-09-13 NOTE — Telephone Encounter (Signed)
 Patient called to check on paperwork that she needs filled out in order to have a caregiver, I let patient know that as soon as paperwork was finished we will reach out to her then.

## 2024-09-17 NOTE — Telephone Encounter (Signed)
 Patients paperwork is being worked on

## 2024-09-18 ENCOUNTER — Other Ambulatory Visit: Payer: Self-pay | Admitting: Family Medicine

## 2024-09-18 DIAGNOSIS — F419 Anxiety disorder, unspecified: Secondary | ICD-10-CM

## 2024-09-18 NOTE — Telephone Encounter (Signed)
 Last appt 08/20/24

## 2024-10-21 ENCOUNTER — Ambulatory Visit: Admitting: Family Medicine
